# Patient Record
Sex: Female | Born: 1935 | Race: White | Hispanic: No | Marital: Married | Smoking: Never smoker
Health system: Southern US, Community
[De-identification: ages and names within clinical notes are randomized; demographics above are authoritative.]

## PROBLEM LIST (undated history)

## (undated) DIAGNOSIS — Z86010 Personal history of colonic polyps: Secondary | ICD-10-CM

## (undated) DIAGNOSIS — R200 Anesthesia of skin: Secondary | ICD-10-CM

## (undated) DIAGNOSIS — C44301 Unspecified malignant neoplasm of skin of nose: Secondary | ICD-10-CM

## (undated) DIAGNOSIS — M810 Age-related osteoporosis without current pathological fracture: Secondary | ICD-10-CM

## (undated) DIAGNOSIS — Z8709 Personal history of other diseases of the respiratory system: Secondary | ICD-10-CM

## (undated) DIAGNOSIS — J189 Pneumonia, unspecified organism: Secondary | ICD-10-CM

## (undated) DIAGNOSIS — R350 Frequency of micturition: Secondary | ICD-10-CM

## (undated) DIAGNOSIS — K219 Gastro-esophageal reflux disease without esophagitis: Secondary | ICD-10-CM

## (undated) DIAGNOSIS — C801 Malignant (primary) neoplasm, unspecified: Secondary | ICD-10-CM

## (undated) DIAGNOSIS — M2550 Pain in unspecified joint: Secondary | ICD-10-CM

## (undated) DIAGNOSIS — R351 Nocturia: Secondary | ICD-10-CM

## (undated) DIAGNOSIS — M1990 Unspecified osteoarthritis, unspecified site: Secondary | ICD-10-CM

## (undated) DIAGNOSIS — Z8669 Personal history of other diseases of the nervous system and sense organs: Secondary | ICD-10-CM

## (undated) DIAGNOSIS — E785 Hyperlipidemia, unspecified: Secondary | ICD-10-CM

## (undated) DIAGNOSIS — J302 Other seasonal allergic rhinitis: Secondary | ICD-10-CM

## (undated) DIAGNOSIS — M2540 Effusion, unspecified joint: Secondary | ICD-10-CM

## (undated) HISTORY — PX: COLONOSCOPY: SHX174

## (undated) HISTORY — PX: OTHER SURGICAL HISTORY: SHX169

## (undated) HISTORY — PX: APPENDECTOMY: SHX54

## (undated) HISTORY — PX: ESOPHAGOGASTRODUODENOSCOPY: SHX1529

## (undated) HISTORY — PX: MASTECTOMY: SHX3

---

## 2011-05-29 DIAGNOSIS — G589 Mononeuropathy, unspecified: Secondary | ICD-10-CM | POA: Diagnosis not present

## 2011-06-18 DIAGNOSIS — M79609 Pain in unspecified limb: Secondary | ICD-10-CM | POA: Diagnosis not present

## 2011-06-19 DIAGNOSIS — M79609 Pain in unspecified limb: Secondary | ICD-10-CM | POA: Diagnosis not present

## 2011-11-21 DIAGNOSIS — H26499 Other secondary cataract, unspecified eye: Secondary | ICD-10-CM | POA: Diagnosis not present

## 2011-11-21 DIAGNOSIS — H04129 Dry eye syndrome of unspecified lacrimal gland: Secondary | ICD-10-CM | POA: Diagnosis not present

## 2011-12-09 DIAGNOSIS — T148XXA Other injury of unspecified body region, initial encounter: Secondary | ICD-10-CM | POA: Diagnosis not present

## 2012-01-16 DIAGNOSIS — Z23 Encounter for immunization: Secondary | ICD-10-CM | POA: Diagnosis not present

## 2012-02-12 DIAGNOSIS — E7800 Pure hypercholesterolemia, unspecified: Secondary | ICD-10-CM | POA: Diagnosis not present

## 2012-02-12 DIAGNOSIS — Z79899 Other long term (current) drug therapy: Secondary | ICD-10-CM | POA: Diagnosis not present

## 2012-02-12 DIAGNOSIS — E559 Vitamin D deficiency, unspecified: Secondary | ICD-10-CM | POA: Diagnosis not present

## 2012-02-12 DIAGNOSIS — K5900 Constipation, unspecified: Secondary | ICD-10-CM | POA: Diagnosis not present

## 2012-03-08 DIAGNOSIS — J069 Acute upper respiratory infection, unspecified: Secondary | ICD-10-CM | POA: Diagnosis not present

## 2012-04-01 DIAGNOSIS — Z1231 Encounter for screening mammogram for malignant neoplasm of breast: Secondary | ICD-10-CM | POA: Diagnosis not present

## 2012-07-01 DIAGNOSIS — M25559 Pain in unspecified hip: Secondary | ICD-10-CM | POA: Diagnosis not present

## 2012-07-09 DIAGNOSIS — M1610 Unilateral primary osteoarthritis, unspecified hip: Secondary | ICD-10-CM | POA: Diagnosis not present

## 2012-08-06 DIAGNOSIS — M6281 Muscle weakness (generalized): Secondary | ICD-10-CM | POA: Diagnosis not present

## 2012-08-06 DIAGNOSIS — R269 Unspecified abnormalities of gait and mobility: Secondary | ICD-10-CM | POA: Diagnosis not present

## 2012-08-06 DIAGNOSIS — M1610 Unilateral primary osteoarthritis, unspecified hip: Secondary | ICD-10-CM | POA: Diagnosis not present

## 2012-08-06 DIAGNOSIS — M899 Disorder of bone, unspecified: Secondary | ICD-10-CM | POA: Diagnosis not present

## 2012-10-06 DIAGNOSIS — C44319 Basal cell carcinoma of skin of other parts of face: Secondary | ICD-10-CM | POA: Diagnosis not present

## 2012-10-14 DIAGNOSIS — N63 Unspecified lump in unspecified breast: Secondary | ICD-10-CM | POA: Diagnosis not present

## 2012-10-18 DIAGNOSIS — N63 Unspecified lump in unspecified breast: Secondary | ICD-10-CM | POA: Diagnosis not present

## 2012-10-20 DIAGNOSIS — C44319 Basal cell carcinoma of skin of other parts of face: Secondary | ICD-10-CM | POA: Diagnosis not present

## 2012-10-25 DIAGNOSIS — R928 Other abnormal and inconclusive findings on diagnostic imaging of breast: Secondary | ICD-10-CM | POA: Diagnosis not present

## 2012-10-25 DIAGNOSIS — C50919 Malignant neoplasm of unspecified site of unspecified female breast: Secondary | ICD-10-CM | POA: Diagnosis not present

## 2012-10-26 DIAGNOSIS — M25559 Pain in unspecified hip: Secondary | ICD-10-CM | POA: Diagnosis not present

## 2012-10-27 DIAGNOSIS — C50919 Malignant neoplasm of unspecified site of unspecified female breast: Secondary | ICD-10-CM | POA: Diagnosis not present

## 2012-11-09 DIAGNOSIS — D485 Neoplasm of uncertain behavior of skin: Secondary | ICD-10-CM | POA: Diagnosis not present

## 2012-11-10 DIAGNOSIS — C50919 Malignant neoplasm of unspecified site of unspecified female breast: Secondary | ICD-10-CM | POA: Diagnosis not present

## 2012-11-15 DIAGNOSIS — C50919 Malignant neoplasm of unspecified site of unspecified female breast: Secondary | ICD-10-CM | POA: Diagnosis not present

## 2012-11-15 DIAGNOSIS — E785 Hyperlipidemia, unspecified: Secondary | ICD-10-CM | POA: Diagnosis not present

## 2012-11-15 DIAGNOSIS — D0590 Unspecified type of carcinoma in situ of unspecified breast: Secondary | ICD-10-CM | POA: Diagnosis not present

## 2012-11-15 DIAGNOSIS — R9431 Abnormal electrocardiogram [ECG] [EKG]: Secondary | ICD-10-CM | POA: Diagnosis not present

## 2012-11-15 DIAGNOSIS — Z79899 Other long term (current) drug therapy: Secondary | ICD-10-CM | POA: Diagnosis not present

## 2012-11-15 DIAGNOSIS — M129 Arthropathy, unspecified: Secondary | ICD-10-CM | POA: Diagnosis not present

## 2012-11-23 DIAGNOSIS — M79609 Pain in unspecified limb: Secondary | ICD-10-CM | POA: Diagnosis not present

## 2012-12-06 DIAGNOSIS — C50919 Malignant neoplasm of unspecified site of unspecified female breast: Secondary | ICD-10-CM | POA: Diagnosis not present

## 2012-12-06 DIAGNOSIS — Z79899 Other long term (current) drug therapy: Secondary | ICD-10-CM | POA: Diagnosis not present

## 2012-12-06 DIAGNOSIS — E785 Hyperlipidemia, unspecified: Secondary | ICD-10-CM | POA: Diagnosis not present

## 2012-12-06 DIAGNOSIS — D0590 Unspecified type of carcinoma in situ of unspecified breast: Secondary | ICD-10-CM | POA: Diagnosis not present

## 2012-12-14 DIAGNOSIS — M76899 Other specified enthesopathies of unspecified lower limb, excluding foot: Secondary | ICD-10-CM | POA: Diagnosis not present

## 2013-01-10 DIAGNOSIS — C50919 Malignant neoplasm of unspecified site of unspecified female breast: Secondary | ICD-10-CM | POA: Diagnosis not present

## 2013-01-10 DIAGNOSIS — Z79899 Other long term (current) drug therapy: Secondary | ICD-10-CM | POA: Diagnosis not present

## 2013-01-10 DIAGNOSIS — N6019 Diffuse cystic mastopathy of unspecified breast: Secondary | ICD-10-CM | POA: Diagnosis not present

## 2013-01-10 DIAGNOSIS — E785 Hyperlipidemia, unspecified: Secondary | ICD-10-CM | POA: Diagnosis not present

## 2013-01-10 DIAGNOSIS — N6089 Other benign mammary dysplasias of unspecified breast: Secondary | ICD-10-CM | POA: Diagnosis not present

## 2013-01-11 DIAGNOSIS — C50919 Malignant neoplasm of unspecified site of unspecified female breast: Secondary | ICD-10-CM | POA: Diagnosis not present

## 2013-01-11 DIAGNOSIS — Z79899 Other long term (current) drug therapy: Secondary | ICD-10-CM | POA: Diagnosis not present

## 2013-01-11 DIAGNOSIS — E785 Hyperlipidemia, unspecified: Secondary | ICD-10-CM | POA: Diagnosis not present

## 2013-01-26 DIAGNOSIS — Z23 Encounter for immunization: Secondary | ICD-10-CM | POA: Diagnosis not present

## 2013-02-08 DIAGNOSIS — M25473 Effusion, unspecified ankle: Secondary | ICD-10-CM | POA: Diagnosis not present

## 2013-02-08 DIAGNOSIS — M79609 Pain in unspecified limb: Secondary | ICD-10-CM | POA: Diagnosis not present

## 2013-02-08 DIAGNOSIS — M7989 Other specified soft tissue disorders: Secondary | ICD-10-CM | POA: Diagnosis not present

## 2013-02-23 DIAGNOSIS — C50919 Malignant neoplasm of unspecified site of unspecified female breast: Secondary | ICD-10-CM | POA: Diagnosis not present

## 2013-02-28 DIAGNOSIS — M25579 Pain in unspecified ankle and joints of unspecified foot: Secondary | ICD-10-CM | POA: Diagnosis not present

## 2013-02-28 DIAGNOSIS — S82409A Unspecified fracture of shaft of unspecified fibula, initial encounter for closed fracture: Secondary | ICD-10-CM | POA: Diagnosis not present

## 2013-03-30 DIAGNOSIS — M25579 Pain in unspecified ankle and joints of unspecified foot: Secondary | ICD-10-CM | POA: Diagnosis not present

## 2013-03-30 DIAGNOSIS — S82409A Unspecified fracture of shaft of unspecified fibula, initial encounter for closed fracture: Secondary | ICD-10-CM | POA: Diagnosis not present

## 2013-04-21 DIAGNOSIS — M899 Disorder of bone, unspecified: Secondary | ICD-10-CM | POA: Diagnosis not present

## 2013-04-21 DIAGNOSIS — M949 Disorder of cartilage, unspecified: Secondary | ICD-10-CM | POA: Diagnosis not present

## 2013-04-21 DIAGNOSIS — Z0000 Encounter for general adult medical examination without abnormal findings: Secondary | ICD-10-CM | POA: Diagnosis not present

## 2013-04-21 DIAGNOSIS — E7800 Pure hypercholesterolemia, unspecified: Secondary | ICD-10-CM | POA: Diagnosis not present

## 2013-05-11 DIAGNOSIS — C50919 Malignant neoplasm of unspecified site of unspecified female breast: Secondary | ICD-10-CM | POA: Diagnosis not present

## 2013-05-11 DIAGNOSIS — S82409A Unspecified fracture of shaft of unspecified fibula, initial encounter for closed fracture: Secondary | ICD-10-CM | POA: Diagnosis not present

## 2013-05-11 DIAGNOSIS — M25579 Pain in unspecified ankle and joints of unspecified foot: Secondary | ICD-10-CM | POA: Diagnosis not present

## 2013-06-03 DIAGNOSIS — Z803 Family history of malignant neoplasm of breast: Secondary | ICD-10-CM | POA: Diagnosis not present

## 2013-06-03 DIAGNOSIS — Z853 Personal history of malignant neoplasm of breast: Secondary | ICD-10-CM | POA: Diagnosis not present

## 2013-06-03 DIAGNOSIS — Z09 Encounter for follow-up examination after completed treatment for conditions other than malignant neoplasm: Secondary | ICD-10-CM | POA: Diagnosis not present

## 2013-06-03 DIAGNOSIS — Z808 Family history of malignant neoplasm of other organs or systems: Secondary | ICD-10-CM | POA: Diagnosis not present

## 2013-06-03 DIAGNOSIS — C50119 Malignant neoplasm of central portion of unspecified female breast: Secondary | ICD-10-CM | POA: Diagnosis not present

## 2013-07-05 DIAGNOSIS — M25559 Pain in unspecified hip: Secondary | ICD-10-CM | POA: Diagnosis not present

## 2013-07-13 DIAGNOSIS — M1610 Unilateral primary osteoarthritis, unspecified hip: Secondary | ICD-10-CM | POA: Diagnosis not present

## 2013-07-14 DIAGNOSIS — L821 Other seborrheic keratosis: Secondary | ICD-10-CM | POA: Diagnosis not present

## 2013-07-18 DIAGNOSIS — C50119 Malignant neoplasm of central portion of unspecified female breast: Secondary | ICD-10-CM | POA: Diagnosis not present

## 2013-07-19 DIAGNOSIS — C50119 Malignant neoplasm of central portion of unspecified female breast: Secondary | ICD-10-CM | POA: Diagnosis not present

## 2013-07-28 DIAGNOSIS — C44319 Basal cell carcinoma of skin of other parts of face: Secondary | ICD-10-CM | POA: Diagnosis not present

## 2013-08-10 DIAGNOSIS — M1610 Unilateral primary osteoarthritis, unspecified hip: Secondary | ICD-10-CM | POA: Diagnosis not present

## 2013-08-15 DIAGNOSIS — M1610 Unilateral primary osteoarthritis, unspecified hip: Secondary | ICD-10-CM | POA: Diagnosis not present

## 2013-08-15 DIAGNOSIS — M25559 Pain in unspecified hip: Secondary | ICD-10-CM | POA: Diagnosis not present

## 2013-09-02 DIAGNOSIS — Z09 Encounter for follow-up examination after completed treatment for conditions other than malignant neoplasm: Secondary | ICD-10-CM | POA: Diagnosis not present

## 2013-09-02 DIAGNOSIS — Z853 Personal history of malignant neoplasm of breast: Secondary | ICD-10-CM | POA: Diagnosis not present

## 2013-09-14 DIAGNOSIS — Z1231 Encounter for screening mammogram for malignant neoplasm of breast: Secondary | ICD-10-CM | POA: Diagnosis not present

## 2013-09-21 DIAGNOSIS — M1610 Unilateral primary osteoarthritis, unspecified hip: Secondary | ICD-10-CM | POA: Diagnosis not present

## 2013-09-27 DIAGNOSIS — M1610 Unilateral primary osteoarthritis, unspecified hip: Secondary | ICD-10-CM | POA: Diagnosis not present

## 2013-09-27 DIAGNOSIS — M169 Osteoarthritis of hip, unspecified: Secondary | ICD-10-CM | POA: Diagnosis not present

## 2013-09-27 DIAGNOSIS — M25559 Pain in unspecified hip: Secondary | ICD-10-CM | POA: Diagnosis not present

## 2013-10-18 DIAGNOSIS — M1610 Unilateral primary osteoarthritis, unspecified hip: Secondary | ICD-10-CM | POA: Diagnosis not present

## 2013-10-18 DIAGNOSIS — Z8781 Personal history of (healed) traumatic fracture: Secondary | ICD-10-CM | POA: Diagnosis not present

## 2013-10-18 DIAGNOSIS — M169 Osteoarthritis of hip, unspecified: Secondary | ICD-10-CM | POA: Diagnosis not present

## 2013-10-27 DIAGNOSIS — M25559 Pain in unspecified hip: Secondary | ICD-10-CM | POA: Diagnosis not present

## 2013-11-03 DIAGNOSIS — M169 Osteoarthritis of hip, unspecified: Secondary | ICD-10-CM | POA: Diagnosis not present

## 2013-11-03 DIAGNOSIS — M1610 Unilateral primary osteoarthritis, unspecified hip: Secondary | ICD-10-CM | POA: Diagnosis not present

## 2013-11-17 ENCOUNTER — Other Ambulatory Visit: Payer: Self-pay | Admitting: Orthopedic Surgery

## 2013-11-30 ENCOUNTER — Encounter (HOSPITAL_COMMUNITY): Payer: Self-pay | Admitting: Pharmacy Technician

## 2013-12-02 ENCOUNTER — Encounter (HOSPITAL_COMMUNITY): Payer: Self-pay

## 2013-12-02 ENCOUNTER — Encounter (HOSPITAL_COMMUNITY)
Admission: RE | Admit: 2013-12-02 | Discharge: 2013-12-02 | Disposition: A | Discharge status: Home / Self Care | Payer: Medicare Other | Source: Ambulatory Visit | Attending: Orthopedic Surgery | Admitting: Orthopedic Surgery

## 2013-12-02 ENCOUNTER — Ambulatory Visit (HOSPITAL_COMMUNITY)
Admission: RE | Admit: 2013-12-02 | Discharge: 2013-12-02 | Disposition: A | Discharge status: Home / Self Care | Payer: Medicare Other | Source: Ambulatory Visit | Attending: Orthopedic Surgery | Admitting: Orthopedic Surgery

## 2013-12-02 DIAGNOSIS — Z853 Personal history of malignant neoplasm of breast: Secondary | ICD-10-CM | POA: Diagnosis not present

## 2013-12-02 DIAGNOSIS — E785 Hyperlipidemia, unspecified: Secondary | ICD-10-CM | POA: Diagnosis not present

## 2013-12-02 DIAGNOSIS — M129 Arthropathy, unspecified: Secondary | ICD-10-CM | POA: Insufficient documentation

## 2013-12-02 DIAGNOSIS — Z01818 Encounter for other preprocedural examination: Secondary | ICD-10-CM | POA: Insufficient documentation

## 2013-12-02 HISTORY — DX: Effusion, unspecified joint: M25.40

## 2013-12-02 HISTORY — DX: Nocturia: R35.1

## 2013-12-02 HISTORY — DX: Hyperlipidemia, unspecified: E78.5

## 2013-12-02 HISTORY — DX: Personal history of colonic polyps: Z86.010

## 2013-12-02 HISTORY — DX: Personal history of other diseases of the respiratory system: Z87.09

## 2013-12-02 HISTORY — DX: Unspecified osteoarthritis, unspecified site: M19.90

## 2013-12-02 HISTORY — DX: Pneumonia, unspecified organism: J18.9

## 2013-12-02 HISTORY — DX: Age-related osteoporosis without current pathological fracture: M81.0

## 2013-12-02 HISTORY — DX: Personal history of other diseases of the nervous system and sense organs: Z86.69

## 2013-12-02 HISTORY — DX: Anesthesia of skin: R20.0

## 2013-12-02 HISTORY — DX: Malignant (primary) neoplasm, unspecified: C80.1

## 2013-12-02 HISTORY — DX: Frequency of micturition: R35.0

## 2013-12-02 HISTORY — DX: Pain in unspecified joint: M25.50

## 2013-12-02 LAB — BASIC METABOLIC PANEL
Anion gap: 15 (ref 5–15)
BUN: 19 mg/dL (ref 6–23)
CHLORIDE: 99 meq/L (ref 96–112)
CO2: 23 mEq/L (ref 19–32)
CREATININE: 0.91 mg/dL (ref 0.50–1.10)
Calcium: 9.1 mg/dL (ref 8.4–10.5)
GFR calc Af Amer: 65 mL/min — ABNORMAL LOW (ref >90)
GFR calc non Af Amer: 56 mL/min — ABNORMAL LOW (ref >90)
Glucose, Bld: 129 mg/dL — ABNORMAL HIGH (ref 70–99)
Potassium: 4.2 mEq/L (ref 3.7–5.3)
Sodium: 137 mEq/L (ref 137–147)

## 2013-12-02 LAB — CBC WITH DIFFERENTIAL/PLATELET
BASOS PCT: 1 % (ref 0–1)
Basophils Absolute: 0 10*3/uL (ref 0.0–0.1)
EOS ABS: 0 10*3/uL (ref 0.0–0.7)
Eosinophils Relative: 1 % (ref 0–5)
HEMATOCRIT: 40.8 % (ref 36.0–46.0)
HEMOGLOBIN: 13.2 g/dL (ref 12.0–15.0)
LYMPHS ABS: 1.9 10*3/uL (ref 0.7–4.0)
Lymphocytes Relative: 29 % (ref 12–46)
MCH: 30.3 pg (ref 26.0–34.0)
MCHC: 32.4 g/dL (ref 30.0–36.0)
MCV: 93.8 fL (ref 78.0–100.0)
MONO ABS: 0.4 10*3/uL (ref 0.1–1.0)
MONOS PCT: 6 % (ref 3–12)
Neutro Abs: 4 10*3/uL (ref 1.7–7.7)
Neutrophils Relative %: 63 % (ref 43–77)
Platelets: 277 10*3/uL (ref 150–400)
RBC: 4.35 MIL/uL (ref 3.87–5.11)
RDW: 13.7 % (ref 11.5–15.5)
WBC: 6.3 10*3/uL (ref 4.0–10.5)

## 2013-12-02 LAB — URINALYSIS, ROUTINE W REFLEX MICROSCOPIC
GLUCOSE, UA: NEGATIVE mg/dL
Hgb urine dipstick: NEGATIVE
KETONES UR: 15 mg/dL — AB
LEUKOCYTES UA: NEGATIVE
NITRITE: NEGATIVE
Protein, ur: NEGATIVE mg/dL
Specific Gravity, Urine: 1.026 (ref 1.005–1.030)
Urobilinogen, UA: 1 mg/dL (ref 0.0–1.0)
pH: 5.5 (ref 5.0–8.0)

## 2013-12-02 LAB — SURGICAL PCR SCREEN
MRSA, PCR: NEGATIVE
STAPHYLOCOCCUS AUREUS: NEGATIVE

## 2013-12-02 LAB — PROTIME-INR
INR: 1 (ref 0.00–1.49)
Prothrombin Time: 13.2 seconds (ref 11.6–15.2)

## 2013-12-02 LAB — APTT: aPTT: 30 seconds (ref 24–37)

## 2013-12-02 LAB — TYPE AND SCREEN
ABO/RH(D): O POS
Antibody Screen: NEGATIVE

## 2013-12-02 MED ORDER — CHLORHEXIDINE GLUCONATE 4 % EX LIQD: 60.0000 mL | Freq: Once | CUTANEOUS | Status: DC

## 2013-12-02 NOTE — Pre-Procedure Instructions (Signed)
CASSADEE VANZANDT  12/02/2013   Your procedure is scheduled on:  Mon, Sept 14 @ 12:10 PM  Report to [facility]  at 10:10 AM.  Call this number if you have problems the morning of surgery: [phone]   Remember:   Do not eat food or drink liquids after midnight.   Take these medicines the morning of surgery with A SIP OF WATER: Pain Pill(if needed)               No Goody's,BC's,Aleve,Aspirin,Ibuprofen,Fish Oil,or any Herbal Medications   Do not wear jewelry, make-up or nail polish.  Do not wear lotions, powders, or perfumes. You may wear deodorant.  Do not shave 48 hours prior to surgery.   Do not bring valuables to the hospital.  [facility] is not responsible                  for any belongings or valuables.               Contacts, dentures or bridgework may not be worn into surgery.  Leave suitcase in the car. After surgery it may be brought to your room.  For patients admitted to the hospital, discharge time is determined by your                treatment team.               Special Instructions:  [facility] - Preparing for Surgery  Before surgery, you can play an important role.  Because skin is not sterile, your skin needs to be as free of germs as possible.  You can reduce the number of germs on you skin by washing with CHG (chlorahexidine gluconate) soap before surgery.  CHG is an antiseptic cleaner which kills germs and bonds with the skin to continue killing germs even after washing.  Please DO NOT use if you have an allergy to CHG or antibacterial soaps.  If your skin becomes reddened/irritated stop using the CHG and inform your nurse when you arrive at Short Stay.  Do not shave (including legs and underarms) for at least 48 hours prior to the first CHG shower.  You may shave your face.  Please follow these instructions carefully:   1.  Shower with CHG Soap the night before surgery and the                                morning of Surgery.  2.  If you choose to  wash your hair, wash your hair first as usual with your       normal shampoo.  3.  After you shampoo, rinse your hair and body thoroughly to remove the                      Shampoo.  4.  Use CHG as you would any other liquid soap.  You can apply chg directly       to the skin and wash gently with scrungie or a clean washcloth.  5.  Apply the CHG Soap to your body ONLY FROM THE NECK DOWN.        Do not use on open wounds or open sores.  Avoid contact with your eyes,       ears, mouth and genitals (private parts).  Wash genitals (private parts)       with your normal soap.  6.  Wash thoroughly, paying special attention to the area where your surgery        will be performed.  7.  Thoroughly rinse your body with warm water from the neck down.  8.  DO NOT shower/wash with your normal soap after using and rinsing off       the CHG Soap.  9.  Pat yourself dry with a clean towel.            10.  Wear clean pajamas.            11.  Place clean sheets on your bed the night of your first shower and do not        sleep with pets.  Day of Surgery  Do not apply any lotions/deoderants the morning of surgery.  Please wear clean clothes to the hospital/surgery center.     Please read over the following fact sheets that you were given: Pain Booklet, Coughing and Deep Breathing, Blood Transfusion Information, MRSA Information and Surgical Site Infection Prevention

## 2013-12-02 NOTE — Progress Notes (Signed)
Pt doesn't have cardiologist  Stress test done > 3yrs   Denies ever having an echo or heart cath  Medical Md is Dr.Lynley Helene Kelp with [facility] in [place]  Denies EKG or CXR in past yr

## 2013-12-03 LAB — ABO/RH: ABO/RH(D): O POS

## 2013-12-07 NOTE — Progress Notes (Signed)
Anesthesia Chart Review:  Pt is 80 year old female scheduled for R hip arthroplasty on 914/15 with Dr. Mayer Camel.   PMH: Hyperlipidemia, breast cancer, numbness, osteoporosis, athritis  Medications include: vytorin, oxycodone  Preoperative labs reviewed.   Chest x-ray reviewed. Questionable nodular density in medial left lung apex. Lordotic views are recommended for further evaluation. Hyperinflated lungs consistent with COPD. Juliann Pulse with Dr. Damita Dunnings office is aware, and is having pt f/u with PCP.   EKG:  Normal sinus rhythm Left axis deviation  If no changes, I anticipate pt can proceed with surgery as scheduled.   Willeen Cass, FNP-BC [facility]/Anesthesiology Phone: [phone] 12/07/2013 3:02 PM

## 2013-12-08 NOTE — H&P (Signed)
TOTAL HIP ADMISSION H&P  Patient is admitted for right total hip arthroplasty.  Subjective:  Chief Complaint: right hip pain  HPI: Erin Lutz, 80 y.o. female, has a history of pain and functional disability in the right hip(s) due to arthritis and patient has failed non-surgical conservative treatments for greater than 12 weeks to include NSAID's and/or analgesics, corticosteriod injections, flexibility and strengthening excercises, use of assistive devices and activity modification.  Onset of symptoms was gradual starting 1 years ago with gradually worsening course since that time.The patient noted no past surgery on the right hip(s).  Patient currently rates pain in the right hip at 10 out of 10 with activity. Patient has night pain, worsening of pain with activity and weight bearing, pain that interfers with activities of daily living and pain with passive range of motion. Patient has evidence of joint space narrowing by imaging studies. This condition presents safety issues increasing the risk of falls.   There is no current active infection.  There are no active problems to display for this patient.  Past Medical History  Diagnosis Date  . Hyperlipidemia     takes Vytorin every other day  . Cancer     breast  . Pneumonia 66 yrs ago  . History of bronchitis 57yrs ago  . History of migraine 69yrs ago  . Numbness     bilateral feet  . Arthritis   . Joint pain   . Joint swelling   . Osteoporosis   . History of colon polyps   . Urinary frequency   . Nocturia     Past Surgical History  Procedure Laterality Date  . Cleft palate surgery  as a child  . Appendectomy    . Polyp removed from uterus    . Cataract surgery Bilateral   . Mastectomy Left   . Colonoscopy    . Esophagogastroduodenoscopy      No prescriptions prior to admission   No Known Allergies  History  Substance Use Topics  . Smoking status: Never Smoker   . Smokeless tobacco: Not on file  . Alcohol Use:  No    No family history on file.   Review of Systems  Constitutional: Negative.   HENT: Negative.   Eyes: Negative.   Respiratory: Negative.   Cardiovascular: Negative.   Gastrointestinal: Negative.   Genitourinary: Negative.   Musculoskeletal: Positive for joint pain.  Skin: Negative.   Endo/Heme/Allergies: Bruises/bleeds easily.  Psychiatric/Behavioral: Positive for depression. The patient is nervous/anxious.     Objective:  Physical Exam  Constitutional: She is oriented to person, place, and time. She appears well-developed and well-nourished.  HENT:  Head: Normocephalic and atraumatic.  Eyes: Pupils are equal, round, and reactive to light.  Neck: Normal range of motion. Neck supple.  Cardiovascular: Intact distal pulses.   Respiratory: Effort normal.  Musculoskeletal: She exhibits tenderness.  Internal rotation of the right hip is to 10 with severe pain external rotation, less so.  The left hip also has limited internal rotation, but is much less irritable.  Foot tap is negative.  Her skin is intact.  She is nontender to palpation over the greater trochanter, buttock or groin although she says the groin is where her pain is.    Neurological: She is alert and oriented to person, place, and time.  Skin: Skin is warm and dry.  Psychiatric: She has a normal mood and affect. Her behavior is normal. Judgment and thought content normal.    Vital signs in  last 24 hours:    Labs:   There is no height or weight on file to calculate BMI.   Imaging Review Plain radiographs demonstrate AP pelvis and crosstable lateral now show near bone-on-bone arthritis right hip greater than left hip there has been significant progression.  There is also irregularity of the femoral head consistent with some focal necrosis, secondary to pressure.  Assessment/Plan:  End stage arthritis, right hip(s)  The patient history, physical examination, clinical judgement of the provider and imaging  studies are consistent with end stage degenerative joint disease of the right hip(s) and total hip arthroplasty is deemed medically necessary. The treatment options including medical management, injection therapy, arthroscopy and arthroplasty were discussed at length. The risks and benefits of total hip arthroplasty were presented and reviewed. The risks due to aseptic loosening, infection, stiffness, dislocation/subluxation,  thromboembolic complications and other imponderables were discussed.  The patient acknowledged the explanation, agreed to proceed with the plan and consent was signed. Patient is being admitted for inpatient treatment for surgery, pain control, PT, OT, prophylactic antibiotics, VTE prophylaxis, progressive ambulation and ADL's and discharge planning.The patient is planning to be discharged to skilled nursing facility

## 2013-12-09 NOTE — Progress Notes (Signed)
Nurse called patient and instructed her to arrive at 0700 on Monday. Patient verbalized understanding.

## 2013-12-11 MED ORDER — DEXTROSE-NACL 5-0.45 % IV SOLN: INTRAVENOUS | Status: DC

## 2013-12-11 MED ORDER — CEFAZOLIN SODIUM-DEXTROSE 2-3 GM-% IV SOLR
2.0000 g | INTRAVENOUS | Status: AC
  Administered 2013-12-12: 2 g via INTRAVENOUS
  Filled 2013-12-11: qty 50

## 2013-12-12 ENCOUNTER — Encounter (HOSPITAL_COMMUNITY): Payer: Self-pay | Admitting: *Deleted

## 2013-12-12 ENCOUNTER — Inpatient Hospital Stay (HOSPITAL_COMMUNITY): Payer: Medicare Other | Admitting: Anesthesiology

## 2013-12-12 ENCOUNTER — Inpatient Hospital Stay (HOSPITAL_COMMUNITY): Payer: Medicare Other

## 2013-12-12 ENCOUNTER — Encounter (HOSPITAL_COMMUNITY): Payer: Medicare Other | Admitting: Emergency Medicine

## 2013-12-12 ENCOUNTER — Inpatient Hospital Stay (HOSPITAL_COMMUNITY)
Admission: RE | Admit: 2013-12-12 | Discharge: 2013-12-15 | DRG: 470 | Disposition: A | Discharge status: Home Health | Payer: Medicare Other | Source: Ambulatory Visit | Attending: Orthopedic Surgery | Admitting: Orthopedic Surgery

## 2013-12-12 ENCOUNTER — Encounter (HOSPITAL_COMMUNITY)
Admission: RE | Disposition: A | Discharge status: Home Health | Payer: Self-pay | Source: Ambulatory Visit | Attending: Orthopedic Surgery

## 2013-12-12 DIAGNOSIS — M1610 Unilateral primary osteoarthritis, unspecified hip: Principal | ICD-10-CM | POA: Diagnosis present

## 2013-12-12 DIAGNOSIS — E785 Hyperlipidemia, unspecified: Secondary | ICD-10-CM | POA: Diagnosis present

## 2013-12-12 DIAGNOSIS — M810 Age-related osteoporosis without current pathological fracture: Secondary | ICD-10-CM | POA: Diagnosis present

## 2013-12-12 DIAGNOSIS — D62 Acute posthemorrhagic anemia: Secondary | ICD-10-CM | POA: Diagnosis not present

## 2013-12-12 DIAGNOSIS — M169 Osteoarthritis of hip, unspecified: Principal | ICD-10-CM | POA: Diagnosis present

## 2013-12-12 DIAGNOSIS — M1611 Unilateral primary osteoarthritis, right hip: Secondary | ICD-10-CM

## 2013-12-12 DIAGNOSIS — M25559 Pain in unspecified hip: Secondary | ICD-10-CM | POA: Diagnosis not present

## 2013-12-12 HISTORY — PX: TOTAL HIP ARTHROPLASTY: SHX124

## 2013-12-12 SURGERY — ARTHROPLASTY, HIP, TOTAL,POSTERIOR APPROACH
Anesthesia: General | Laterality: Right

## 2013-12-12 MED ORDER — BUPIVACAINE-EPINEPHRINE 0.5% -1:200000 IJ SOLN
INTRAMUSCULAR | Status: DC | PRN
  Administered 2013-12-12: 20 mL

## 2013-12-12 MED ORDER — METHOCARBAMOL 500 MG PO TABS
ORAL_TABLET | ORAL | Status: AC
  Filled 2013-12-12: qty 1

## 2013-12-12 MED ORDER — CEFUROXIME SODIUM 1.5 G IJ SOLR
INTRAMUSCULAR | Status: DC | PRN
  Administered 2013-12-12: 1.5 g

## 2013-12-12 MED ORDER — GLYCOPYRROLATE 0.2 MG/ML IJ SOLN
INTRAMUSCULAR | Status: AC
  Filled 2013-12-12: qty 3

## 2013-12-12 MED ORDER — FENTANYL CITRATE 0.05 MG/ML IJ SOLN
INTRAMUSCULAR | Status: DC | PRN
  Administered 2013-12-12: 50 ug via INTRAVENOUS
  Administered 2013-12-12: 100 ug via INTRAVENOUS
  Administered 2013-12-12 (×2): 25 ug via INTRAVENOUS

## 2013-12-12 MED ORDER — MENTHOL 3 MG MT LOZG: 1.0000 | LOZENGE | OROMUCOSAL | Status: DC | PRN

## 2013-12-12 MED ORDER — HYDROMORPHONE HCL PF 1 MG/ML IJ SOLN: 0.5000 mg | INTRAMUSCULAR | Status: DC | PRN

## 2013-12-12 MED ORDER — LACTATED RINGERS IV SOLN
INTRAVENOUS | Status: DC | PRN
  Administered 2013-12-12: 09:00:00 via INTRAVENOUS

## 2013-12-12 MED ORDER — SENNOSIDES-DOCUSATE SODIUM 8.6-50 MG PO TABS: 1.0000 | ORAL_TABLET | Freq: Every evening | ORAL | Status: DC | PRN

## 2013-12-12 MED ORDER — METHOCARBAMOL 1000 MG/10ML IJ SOLN
500.0000 mg | Freq: Four times a day (QID) | INTRAVENOUS | Status: DC | PRN
  Filled 2013-12-12: qty 5

## 2013-12-12 MED ORDER — METOCLOPRAMIDE HCL 5 MG/ML IJ SOLN
5.0000 mg | Freq: Three times a day (TID) | INTRAMUSCULAR | Status: DC | PRN
  Filled 2013-12-12: qty 2

## 2013-12-12 MED ORDER — FLEET ENEMA 7-19 GM/118ML RE ENEM: 1.0000 | ENEMA | Freq: Once | RECTAL | Status: AC | PRN

## 2013-12-12 MED ORDER — NEOSTIGMINE METHYLSULFATE 10 MG/10ML IV SOLN
INTRAVENOUS | Status: AC
Start: 2013-12-12 — End: 2013-12-12
  Filled 2013-12-12: qty 1

## 2013-12-12 MED ORDER — CEFUROXIME SODIUM 1.5 G IJ SOLR
INTRAMUSCULAR | Status: AC
  Filled 2013-12-12: qty 1.5

## 2013-12-12 MED ORDER — ALUMINUM HYDROXIDE GEL 320 MG/5ML PO SUSP
15.0000 mL | ORAL | Status: DC | PRN
  Filled 2013-12-12: qty 30

## 2013-12-12 MED ORDER — LIDOCAINE HCL (CARDIAC) 20 MG/ML IV SOLN
INTRAVENOUS | Status: AC
  Filled 2013-12-12: qty 5

## 2013-12-12 MED ORDER — PROPOFOL 10 MG/ML IV BOLUS
INTRAVENOUS | Status: DC | PRN
Start: 2013-12-12 — End: 2013-12-12
  Administered 2013-12-12: 150 mg via INTRAVENOUS

## 2013-12-12 MED ORDER — ACETAMINOPHEN 325 MG PO TABS
650.0000 mg | ORAL_TABLET | Freq: Four times a day (QID) | ORAL | Status: DC | PRN
  Administered 2013-12-15: 650 mg via ORAL
  Filled 2013-12-12: qty 2

## 2013-12-12 MED ORDER — OXYCODONE HCL 5 MG PO TABS
5.0000 mg | ORAL_TABLET | ORAL | Status: DC | PRN
  Administered 2013-12-12 – 2013-12-13 (×4): 5 mg via ORAL
  Administered 2013-12-13: 10 mg via ORAL
  Administered 2013-12-13: 5 mg via ORAL
  Administered 2013-12-13: 10 mg via ORAL
  Administered 2013-12-13: 5 mg via ORAL
  Administered 2013-12-14: 10 mg via ORAL
  Filled 2013-12-12 (×2): qty 1
  Filled 2013-12-12: qty 2
  Filled 2013-12-12: qty 1
  Filled 2013-12-12 (×3): qty 2

## 2013-12-12 MED ORDER — LACTATED RINGERS IV SOLN
INTRAVENOUS | Status: DC
  Administered 2013-12-12: 08:00:00 via INTRAVENOUS

## 2013-12-12 MED ORDER — BUPIVACAINE-EPINEPHRINE (PF) 0.5% -1:200000 IJ SOLN
INTRAMUSCULAR | Status: AC
  Filled 2013-12-12: qty 30

## 2013-12-12 MED ORDER — ASPIRIN EC 325 MG PO TBEC
325.0000 mg | DELAYED_RELEASE_TABLET | Freq: Every day | ORAL | Status: DC
  Administered 2013-12-13 – 2013-12-15 (×3): 325 mg via ORAL
  Filled 2013-12-12 (×4): qty 1

## 2013-12-12 MED ORDER — ONDANSETRON HCL 4 MG/2ML IJ SOLN
4.0000 mg | Freq: Four times a day (QID) | INTRAMUSCULAR | Status: DC | PRN
  Filled 2013-12-12: qty 2

## 2013-12-12 MED ORDER — SODIUM CHLORIDE 0.9 % IV SOLN
1000.0000 mg | INTRAVENOUS | Status: AC
  Administered 2013-12-12: 1000 mg via INTRAVENOUS
  Filled 2013-12-12: qty 10

## 2013-12-12 MED ORDER — PROPOFOL 10 MG/ML IV BOLUS
INTRAVENOUS | Status: AC
  Filled 2013-12-12: qty 20

## 2013-12-12 MED ORDER — ONDANSETRON HCL 4 MG/2ML IJ SOLN
INTRAMUSCULAR | Status: DC | PRN
  Administered 2013-12-12: 4 mg via INTRAVENOUS

## 2013-12-12 MED ORDER — ONDANSETRON HCL 4 MG/2ML IJ SOLN
INTRAMUSCULAR | Status: AC
  Filled 2013-12-12: qty 2

## 2013-12-12 MED ORDER — ASPIRIN EC 325 MG PO TBEC: 325.0000 mg | DELAYED_RELEASE_TABLET | Freq: Two times a day (BID) | ORAL | Status: DC

## 2013-12-12 MED ORDER — SUCCINYLCHOLINE CHLORIDE 20 MG/ML IJ SOLN
INTRAMUSCULAR | Status: AC
  Filled 2013-12-12: qty 1

## 2013-12-12 MED ORDER — LIDOCAINE HCL (CARDIAC) 20 MG/ML IV SOLN
INTRAVENOUS | Status: DC | PRN
  Administered 2013-12-12: 50 mg via INTRAVENOUS

## 2013-12-12 MED ORDER — PHENOL 1.4 % MT LIQD: 1.0000 | OROMUCOSAL | Status: DC | PRN

## 2013-12-12 MED ORDER — BISACODYL 5 MG PO TBEC: 5.0000 mg | DELAYED_RELEASE_TABLET | Freq: Every day | ORAL | Status: DC | PRN

## 2013-12-12 MED ORDER — ROCURONIUM BROMIDE 100 MG/10ML IV SOLN
INTRAVENOUS | Status: DC | PRN
  Administered 2013-12-12: 40 mg via INTRAVENOUS

## 2013-12-12 MED ORDER — HYDROMORPHONE HCL PF 1 MG/ML IJ SOLN
INTRAMUSCULAR | Status: AC
  Filled 2013-12-12: qty 1

## 2013-12-12 MED ORDER — DOCUSATE SODIUM 100 MG PO CAPS
100.0000 mg | ORAL_CAPSULE | Freq: Two times a day (BID) | ORAL | Status: DC
  Administered 2013-12-12 – 2013-12-15 (×6): 100 mg via ORAL
  Filled 2013-12-12 (×9): qty 1

## 2013-12-12 MED ORDER — FENTANYL CITRATE 0.05 MG/ML IJ SOLN
INTRAMUSCULAR | Status: AC
  Filled 2013-12-12: qty 5

## 2013-12-12 MED ORDER — HYDROMORPHONE HCL PF 1 MG/ML IJ SOLN
0.2500 mg | INTRAMUSCULAR | Status: DC | PRN
Start: 2013-12-12 — End: 2013-12-12
  Administered 2013-12-12 (×4): 0.5 mg via INTRAVENOUS

## 2013-12-12 MED ORDER — SODIUM CHLORIDE 0.9 % IR SOLN
Status: DC | PRN
  Administered 2013-12-12: 1000 mL

## 2013-12-12 MED ORDER — OXYCODONE HCL 5 MG PO TABS
ORAL_TABLET | ORAL | Status: AC
  Filled 2013-12-12: qty 1

## 2013-12-12 MED ORDER — NEOSTIGMINE METHYLSULFATE 10 MG/10ML IV SOLN
INTRAVENOUS | Status: DC | PRN
  Administered 2013-12-12: 3 mg via INTRAVENOUS

## 2013-12-12 MED ORDER — ROCURONIUM BROMIDE 50 MG/5ML IV SOLN
INTRAVENOUS | Status: AC
  Filled 2013-12-12: qty 1

## 2013-12-12 MED ORDER — METHOCARBAMOL 500 MG PO TABS
500.0000 mg | ORAL_TABLET | Freq: Four times a day (QID) | ORAL | Status: DC | PRN
  Administered 2013-12-12: 500 mg via ORAL
  Filled 2013-12-12: qty 1

## 2013-12-12 MED ORDER — ONDANSETRON HCL 4 MG PO TABS
4.0000 mg | ORAL_TABLET | Freq: Four times a day (QID) | ORAL | Status: DC | PRN
  Filled 2013-12-12: qty 1

## 2013-12-12 MED ORDER — METOCLOPRAMIDE HCL 5 MG PO TABS
5.0000 mg | ORAL_TABLET | Freq: Three times a day (TID) | ORAL | Status: DC | PRN
  Filled 2013-12-12: qty 2

## 2013-12-12 MED ORDER — DIPHENHYDRAMINE HCL 12.5 MG/5ML PO ELIX: 12.5000 mg | ORAL_SOLUTION | ORAL | Status: DC | PRN

## 2013-12-12 MED ORDER — ACETAMINOPHEN 650 MG RE SUPP: 650.0000 mg | Freq: Four times a day (QID) | RECTAL | Status: DC | PRN

## 2013-12-12 MED ORDER — METHOCARBAMOL 500 MG PO TABS: 500.0000 mg | ORAL_TABLET | Freq: Two times a day (BID) | ORAL | Status: DC

## 2013-12-12 MED ORDER — OXYCODONE-ACETAMINOPHEN 5-325 MG PO TABS: 1.0000 | ORAL_TABLET | ORAL | Status: DC | PRN

## 2013-12-12 MED ORDER — GLYCOPYRROLATE 0.2 MG/ML IJ SOLN
INTRAMUSCULAR | Status: DC | PRN
  Administered 2013-12-12: .6 mg via INTRAVENOUS

## 2013-12-12 MED ORDER — KCL IN DEXTROSE-NACL 20-5-0.45 MEQ/L-%-% IV SOLN
INTRAVENOUS | Status: DC
  Administered 2013-12-13 (×2): via INTRAVENOUS
  Filled 2013-12-12 (×11): qty 1000

## 2013-12-12 SURGICAL SUPPLY — 55 items
BLADE SAW SGTL 18X1.27X75 (BLADE) ×2 IMPLANT
BLADE SAW SGTL 18X1.27X75MM (BLADE) ×1
BRUSH FEMORAL CANAL (MISCELLANEOUS) IMPLANT
CAPT HIP MOP CEMENTED ×3 IMPLANT
CEMENT BONE DEPUY (Cement) ×6 IMPLANT
CEMENT RESTRICTOR DEPUY SZ 3 (Cement) ×3 IMPLANT
COVER BACK TABLE 24X17X13 BIG (DRAPES) IMPLANT
COVER SURGICAL LIGHT HANDLE (MISCELLANEOUS) ×6 IMPLANT
DRAPE ORTHO SPLIT 77X108 STRL (DRAPES) ×2
DRAPE PROXIMA HALF (DRAPES) ×3 IMPLANT
DRAPE SURG ORHT 6 SPLT 77X108 (DRAPES) ×1 IMPLANT
DRAPE U-SHAPE 47X51 STRL (DRAPES) ×3 IMPLANT
DRILL BIT 7/64X5 (BIT) ×3 IMPLANT
DRSG AQUACEL AG ADV 3.5X10 (GAUZE/BANDAGES/DRESSINGS) ×3 IMPLANT
DURAPREP 26ML APPLICATOR (WOUND CARE) ×3 IMPLANT
ELECT BLADE 4.0 EZ CLEAN MEGAD (MISCELLANEOUS) ×3
ELECT REM PT RETURN 9FT ADLT (ELECTROSURGICAL) ×3
ELECTRODE BLDE 4.0 EZ CLN MEGD (MISCELLANEOUS) ×1 IMPLANT
ELECTRODE REM PT RTRN 9FT ADLT (ELECTROSURGICAL) ×1 IMPLANT
GAUZE XEROFORM 1X8 LF (GAUZE/BANDAGES/DRESSINGS) ×3 IMPLANT
GLOVE BIO SURGEON STRL SZ7.5 (GLOVE) ×3 IMPLANT
GLOVE BIO SURGEON STRL SZ8.5 (GLOVE) ×6 IMPLANT
GLOVE BIOGEL PI IND STRL 8 (GLOVE) ×2 IMPLANT
GLOVE BIOGEL PI IND STRL 9 (GLOVE) ×1 IMPLANT
GLOVE BIOGEL PI INDICATOR 8 (GLOVE) ×4
GLOVE BIOGEL PI INDICATOR 9 (GLOVE) ×2
GOWN STRL REUS W/ TWL LRG LVL3 (GOWN DISPOSABLE) ×1 IMPLANT
GOWN STRL REUS W/ TWL XL LVL3 (GOWN DISPOSABLE) ×2 IMPLANT
GOWN STRL REUS W/TWL LRG LVL3 (GOWN DISPOSABLE) ×2
GOWN STRL REUS W/TWL XL LVL3 (GOWN DISPOSABLE) ×4
HANDPIECE INTERPULSE COAX TIP (DISPOSABLE)
HOOD PEEL AWAY FACE SHEILD DIS (HOOD) ×6 IMPLANT
KIT BASIN OR (CUSTOM PROCEDURE TRAY) ×3 IMPLANT
KIT ROOM TURNOVER OR (KITS) ×3 IMPLANT
MANIFOLD NEPTUNE II (INSTRUMENTS) ×3 IMPLANT
NEEDLE 22X1 1/2 (OR ONLY) (NEEDLE) ×3 IMPLANT
NS IRRIG 1000ML POUR BTL (IV SOLUTION) ×3 IMPLANT
PACK TOTAL JOINT (CUSTOM PROCEDURE TRAY) ×3 IMPLANT
PAD ARMBOARD 7.5X6 YLW CONV (MISCELLANEOUS) ×6 IMPLANT
PASSER SUT SWANSON 36MM LOOP (INSTRUMENTS) ×3 IMPLANT
PRESSURIZER FEMORAL UNIV (MISCELLANEOUS) ×3 IMPLANT
SET HNDPC FAN SPRY TIP SCT (DISPOSABLE) IMPLANT
SUT ETHIBOND 2 V 37 (SUTURE) ×3 IMPLANT
SUT VIC AB 0 CTB1 27 (SUTURE) ×3 IMPLANT
SUT VIC AB 1 CTX 36 (SUTURE) ×2
SUT VIC AB 1 CTX36XBRD ANBCTR (SUTURE) ×1 IMPLANT
SUT VIC AB 2-0 CTB1 (SUTURE) ×3 IMPLANT
SUT VIC AB 3-0 SH 27 (SUTURE) ×2
SUT VIC AB 3-0 SH 27X BRD (SUTURE) ×1 IMPLANT
SYR CONTROL 10ML LL (SYRINGE) ×3 IMPLANT
TOWEL OR 17X24 6PK STRL BLUE (TOWEL DISPOSABLE) ×3 IMPLANT
TOWEL OR 17X26 10 PK STRL BLUE (TOWEL DISPOSABLE) ×3 IMPLANT
TOWER CARTRIDGE SMART MIX (DISPOSABLE) ×3 IMPLANT
TRAY FOLEY CATH 14FR (SET/KITS/TRAYS/PACK) IMPLANT
WATER STERILE IRR 1000ML POUR (IV SOLUTION) ×12 IMPLANT

## 2013-12-12 NOTE — Interval H&P Note (Signed)
History and Physical Interval Note:  12/12/2013 7:17 AM  Erin Lutz  has presented today for surgery, with the diagnosis of OSTEOARTHRITIS RIGHT HIP  The various methods of treatment have been discussed with the patient and family. After consideration of risks, benefits and other options for treatment, the patient has consented to  Procedure(s): RIGHT TOTAL HIP ARTHROPLASTY (Right) as a surgical intervention .  The patient's history has been reviewed, patient examined, no change in status, stable for surgery.  I have reviewed the patient's chart and labs.  Questions were answered to the patient's satisfaction.     Kerin Salen

## 2013-12-12 NOTE — Transfer of Care (Signed)
Immediate Anesthesia Transfer of Care Note  Patient: Erin Lutz  Procedure(s) Performed: Procedure(s): RIGHT TOTAL HIP ARTHROPLASTY (Right)  Patient Location: PACU  Anesthesia Type:General  Level of Consciousness: awake and alert   Airway & Oxygen Therapy: Patient Spontanous Breathing and Patient connected to nasal cannula oxygen  Post-op Assessment: Report given to PACU RN and Post -op Vital signs reviewed and stable  Post vital signs: Reviewed and stable  Complications: No apparent anesthesia complications

## 2013-12-12 NOTE — Anesthesia Postprocedure Evaluation (Signed)
  Anesthesia Post-op Note  Patient: Erin Lutz  Procedure(s) Performed: Procedure(s): RIGHT TOTAL HIP ARTHROPLASTY (Right)  Patient Location: PACU  Anesthesia Type:General  Level of Consciousness: awake  Airway and Oxygen Therapy: Patient Spontanous Breathing  Post-op Pain: mild  Post-op Assessment: Post-op Vital signs reviewed  Post-op Vital Signs: Reviewed  Last Vitals:  Filed Vitals:   12/12/13 1225  BP:   Pulse:   Temp: 36.2 C  Resp:     Complications: No apparent anesthesia complications

## 2013-12-12 NOTE — Anesthesia Procedure Notes (Signed)
Procedure Name: Intubation Date/Time: 12/12/2013 9:19 AM Performed by: Eligha Bridegroom Pre-anesthesia Checklist: Patient identified, Timeout performed, Emergency Drugs available, Suction available and Patient being monitored Patient Re-evaluated:Patient Re-evaluated prior to inductionOxygen Delivery Method: Circle system utilized Preoxygenation: Pre-oxygenation with 100% oxygen Intubation Type: IV induction Ventilation: Mask ventilation without difficulty Grade View: Grade III Tube type: Oral Tube size: 7.0 mm Number of attempts: 1 Airway Equipment and Method: Video-laryngoscopy and Stylet Placement Confirmation: ETT inserted through vocal cords under direct vision,  breath sounds checked- equal and bilateral and positive ETCO2 Secured at: 22 cm Tube secured with: Tape Dental Injury: Teeth and Oropharynx as per pre-operative assessment  Difficulty Due To: Difficulty was anticipated and Difficult Airway- due to anterior larynx

## 2013-12-12 NOTE — Anesthesia Preprocedure Evaluation (Signed)
Anesthesia Evaluation   Patient awake    Reviewed: Allergy & Precautions, H&P , NPO status , Patient's Chart, lab work & pertinent test results  Airway       Dental   Pulmonary pneumonia -,          Cardiovascular     Neuro/Psych    GI/Hepatic   Endo/Other    Renal/GU      Musculoskeletal  (+) Arthritis -,   Abdominal   Peds  Hematology   Anesthesia Other Findings   Reproductive/Obstetrics                           Anesthesia Physical Anesthesia Plan  ASA: III  Anesthesia Plan: General   Post-op Pain Management:    Induction: Intravenous  Airway Management Planned: Oral ETT  Additional Equipment:   Intra-op Plan:   Post-operative Plan: Possible Post-op intubation/ventilation  Informed Consent: I have reviewed the patients History and Physical, chart, labs and discussed the procedure including the risks, benefits and alternatives for the proposed anesthesia with the patient or authorized representative who has indicated his/her understanding and acceptance.   Dental advisory given  Plan Discussed with: CRNA and Anesthesiologist  Anesthesia Plan Comments:         Anesthesia Quick Evaluation

## 2013-12-12 NOTE — Op Note (Addendum)
PATIENT ID:      Erin Lutz  MRN:     [id number] DOB/AGE:    November 08, 1925 / 80 y.o.       OPERATIVE REPORT    DATE OF PROCEDURE:  12/12/2013       PREOPERATIVE DIAGNOSIS:  OSTEOARTHRITIS RIGHT HIP                                                       Estimated body mass index is 25.97 kg/(m^2) as calculated from the following:   Height as of 12/02/13: 5\' 2"  (1.575 m).   Weight as of this encounter: 64.411 kg (142 lb).     POSTOPERATIVE DIAGNOSIS:  OSTEOARTHRITIS RIGHT HIP                                                           PROCEDURE:  R total hip arthroplasty using a 48 mm DePuy Pinnacle  Cup, [employer], 10-degree polyethylene liner index superior  and posterior, a +1 32 mm metal head, a #4 Summit basic stem, cemented double batch of DePuy HV cement with 1500 mg of Zinacef, 11 mm centralizer and #3 central canal occluder   SURGEON: Daevion Navarette J    ASSISTANT:   Eric K. Barton Dubois  (present throughout entire procedure and necessary for timely completion of the procedure)  ANESTHESIA:  General  BLOOD LOSS: * No blood loss amount entered *  FLUID REPLACEMENT: 1500 crystalloid DRAINS: Foley Catheter COMPLICATIONS:  None    INDICATIONS FOR PROCEDURE:Patient with end-stage arthritis of the R hip.  X-rays show bone-on-bone arthritic changes. Despite conservative measures with observation, anti-inflammatory medicine, narcotics, use of a cane, has severe unremitting pain and can ambulate only 2 blocks before resting.  Patient desires elective right total hip arthroplasty to decrease pain and increase function. The risks, benefits, and alternatives were discussed at length including but not limited to the risks of infection, bleeding, nerve injury, stiffness, blood clots, the need for revision surgery, cardiopulmonary complications, among others, and they were willing to proceed.Benefits have been discussed. Questions answered.     PROCEDURE IN DETAIL: The patient was  identified by armband,  received preoperative IV antibiotics in the holding area at [facility], taken to the operating room , appropriate anesthetic monitors  were attached and general endotracheal anesthesia induced. Foley catheter was inserted. Patient was rolled into the L lateral decubitus position and fixed there with a Stulberg Mark II pelvic clamp and the R lower extremity was then prepped and draped  in the usual sterile fashion from the ankle to the hemipelvis. A time-out  procedure was performed. The skin along the lateral hip and thigh  infiltrated with 10 mL of 0.5% Marcaine and epinephrine solution. We  then made a posterolateral approach to the hip. With a #10 blade, 18 cm  incision through skin and subcutaneous tissue down to the level of the  IT band. Small bleeders were identified and cauterized. IT band cut in  line with skin incision exposing the greater trochanter. A Cobra retractor was placed between the gluteus minimus and the superior hip joint capsule, and  a spiked Cobra between the quadratus femoris and the inferior hip joint capsule. This isolated the short  external rotators and piriformis tendons. These were tagged with a #2 Ethibond  suture and cut off their insertion on the intertrochanteric crest. The posterior  capsule was then developed into an acetabular-based flap from Posterior Superior off of the acetabulum out over the femoral neck and back posterior inferior to the acetabular rim. This flap was tagged with two #2 Ethibond sutures and retracted protecting the sciatic nerve. This exposed the arthritic femoral head and osteophytes. The hip was then flexed and internally rotated, dislocating the femoral head and a standard neck cut performed 1 fingerbreadth above the lesser trochanter.  A spiked Cobra was placed in the cotyloid notch and a Hohmann retractor was then used to lever the femur anteriorly off of the anterior pelvic column. A posterior-inferior wing  retractor was placed at the junction of the acetabulum and the ischium completing the acetabular exposure.We then removed the peripheral osteophytes and labrum from the acetabulum. We then reamed the acetabulum up to 47 mm with basket reamers obtaining good coverage in all quadrants, irrigated out with normal  saline solution and hammered into place a 48 mm pinnacle cup in 45  degrees of abduction and about 20 degrees of anteversion. More  peripheral osteophytes removed and a trial 10-degree liner placed with the  index superior-posterior. The hip was then flexed and internally rotated exposing the  proximal femur, which was entered with the initiating reamer followed by  the tapered reamers up to a 4-5 tapered reamer and broaching up to a #4 broach, which had good fit and fill. A trial reduction was then  performed with a +1  32-mm ball on the standard neck and  excellent stability was noted with at 90 of flexion with 75 of  internal rotation and then full extension withexternal rotation. The hip  could not be dislocated in full extension. The knee could easily flex  to about 140 degrees. We also stretched the abductors at this point,  because of the preexisting adductor contractures. All trial components  were then removed. The acetabulum was irrigated out with normal saline  solution. A titanium Apex [facility] was then screwed into place  followed by a 10-degree polyethylene liner index superior-posterior. On  the femoral side, we then sized for a #3 cement restrictor and irrigated  out the femoral canal with normal saline solution and dried with suction  and sponges. The cement restrictor was placed to the appropriate depth. A  double batch of DePuy 1 cement with 1500 mg of Zinacef was mixed and  injected into the canal under pressure followed by #4 Summit basic stem  in 20 degrees of anteversion and as this was held in place, excess cement  was removed. At this point, a +0 32-mm  metal head was  hammered on the stem. The hip was reduced. We checked our stability  one more time and found to be excellent. The wound was once again  thoroughly irrigated out with normal saline solution pulse lavage. The  capsular flap and short external rotators were repaired back to the  intertrochanteric crest through drill holes with a #2 Ethibond suture.  The IT band was closed with running 1 Vicryl suture. The subcutaneous  tissue with 0 and 2-0 undyed Vicryl suture and the skin with running  interlocking 3-0 nylon suture. Dressing of Xeroform and Mepilex was  then applied. The patient was then unclamped, rolled  supine, awaken extubated and taken to recovery room without difficulty in stable condition.   Anastassia Noack J 12/12/2013, 10:27 AM

## 2013-12-13 LAB — CBC
HCT: 34.3 % — ABNORMAL LOW (ref 36.0–46.0)
HEMOGLOBIN: 11.1 g/dL — AB (ref 12.0–15.0)
MCH: 30.5 pg (ref 26.0–34.0)
MCHC: 32.4 g/dL (ref 30.0–36.0)
MCV: 94.2 fL (ref 78.0–100.0)
Platelets: 183 10*3/uL (ref 150–400)
RBC: 3.64 MIL/uL — AB (ref 3.87–5.11)
RDW: 13.8 % (ref 11.5–15.5)
WBC: 8.3 10*3/uL (ref 4.0–10.5)

## 2013-12-13 LAB — BASIC METABOLIC PANEL
Anion gap: 13 (ref 5–15)
BUN: 9 mg/dL (ref 6–23)
CHLORIDE: 98 meq/L (ref 96–112)
CO2: 24 mEq/L (ref 19–32)
Calcium: 8.2 mg/dL — ABNORMAL LOW (ref 8.4–10.5)
Creatinine, Ser: 0.73 mg/dL (ref 0.50–1.10)
GFR calc Af Amer: 87 mL/min — ABNORMAL LOW (ref >90)
GFR calc non Af Amer: 75 mL/min — ABNORMAL LOW (ref >90)
GLUCOSE: 119 mg/dL — AB (ref 70–99)
Potassium: 4 mEq/L (ref 3.7–5.3)
SODIUM: 135 meq/L — AB (ref 137–147)

## 2013-12-13 NOTE — Evaluation (Signed)
Physical Therapy Evaluation Patient Details Name: Erin Lutz MRN: [id number] DOB: 03-28-1926 Today's Date: 12/13/2013   History of Present Illness  80 y.o. female s/p right total hip arthroplasty via posterior approach.  Clinical Impression  Pt is s/p right THA resulting in the deficits listed below (see PT Problem List). Mod assist for bed mobility and transfers with min guard for ambulation. Pt eager to return home however will need to progress to a safe level of independence prior to returning home. She is motivated to improve and has several steps to climb to enter her house. Will continue to follow acutely, working on these functional tasks.  Pt will benefit from skilled PT to increase their independence and safety with mobility to allow discharge to the venue listed below.        Follow Up Recommendations Home health PT;Supervision for mobility/OOB    Equipment Recommendations  3in1 (PT)    Recommendations for Other Services       Precautions / Restrictions Precautions Precautions: Posterior Hip Precaution Booklet Issued: Yes (comment) Precaution Comments: Reviewed posterior hip precautions Restrictions Weight Bearing Restrictions: Yes RLE Weight Bearing: Weight bearing as tolerated      Mobility  Bed Mobility Overal bed mobility: Needs Assistance Bed Mobility: Supine to Sit     Supine to sit: Mod assist;HOB elevated     General bed mobility comments: Mod assist for trunk control and use of bed pad to scoot forward with HOB slightly elevated and some support for RLE out of bed. She was able to however safely maintain posterior hip precautions while transfering out of the right side of the bed. VC for technique  Transfers Overall transfer level: Needs assistance Equipment used: Rolling walker (2 wheeled) Transfers: Sit to/from Stand Sit to Stand: Mod assist         General transfer comment: Mod assist from lowest bed setting (similar to bed at home) for  boost to stand with VC for hand placement and technique.  Ambulation/Gait Ambulation/Gait assistance: Min guard Ambulation Distance (Feet): 20 Feet Assistive device: Rolling walker (2 wheeled) Gait Pattern/deviations: Step-to pattern;Decreased step length - right;Decreased step length - left;Decreased stance time - right;Decreased stride length;Decreased dorsiflexion - right;Decreased weight shift to right;Shuffle;Antalgic   Gait velocity interpretation: Below normal speed for age/gender General Gait Details: Educated on safe DME use with rolling walker. Poor right foot clearance with swing limb advancement, improves slightly with max cues. VC for sequencing. Very slow and guarded.  Stairs            Wheelchair Mobility    Modified Rankin (Stroke Patients Only)       Balance Overall balance assessment: Needs assistance Sitting-balance support: No upper extremity supported;Feet supported Sitting balance-Leahy Scale: Good     Standing balance support: Bilateral upper extremity supported Standing balance-Leahy Scale: Poor                               Pertinent Vitals/Pain Pain Assessment: 0-10 Pain Score: 5  Pain Location: Right hip Pain Descriptors / Indicators: Throbbing Pain Intervention(s): Monitored during session;Premedicated before session    Home Living Family/patient expects to be discharged to:: Private residence Living Arrangements: Other (Comment);Spouse/significant other (Daughter-in-law) Available Help at Discharge: Family;Available 24 hours/day Type of Home: House Home Access: Stairs to enter Entrance Stairs-Rails: Psychiatric nurse of Steps: 3 Home Layout: Two level;Laundry or work area in basement;Able to live on main level with bedroom/bathroom Home Equipment:  Walker - 2 wheels      Prior Function Level of Independence: Needs assistance   Gait / Transfers Assistance Needed: Cane for ambulation  ADL's / Homemaking  Assistance Needed: Needed assist for dressing lower body        Hand Dominance   Dominant Hand: Right    Extremity/Trunk Assessment   Upper Extremity Assessment: Overall WFL for tasks assessed           Lower Extremity Assessment: RLE deficits/detail RLE Deficits / Details: Decreased strength and ROM as expected post-op - poor hip flexion functionally with ambulation       Communication   Communication: No difficulties  Cognition Arousal/Alertness: Awake/alert Behavior During Therapy: WFL for tasks assessed/performed Overall Cognitive Status: Within Functional Limits for tasks assessed                      General Comments      Exercises Total Joint Exercises Ankle Circles/Pumps: AROM;Both;10 reps;Seated Quad Sets: Strengthening;Both;10 reps;Seated Gluteal Sets: Strengthening;Both;10 reps;Seated      Assessment/Plan    PT Assessment Patient needs continued PT services  PT Diagnosis Difficulty walking;Abnormality of gait;Acute pain   PT Problem List Decreased strength;Decreased range of motion;Decreased activity tolerance;Decreased balance;Decreased mobility;Decreased knowledge of use of DME;Decreased knowledge of precautions;Pain  PT Treatment Interventions DME instruction;Gait training;Stair training;Functional mobility training;Therapeutic activities;Therapeutic exercise;Balance training;Neuromuscular re-education;Patient/family education;Modalities   PT Goals (Current goals can be found in the Care Plan section) Acute Rehab PT Goals Patient Stated Goal: Go home PT Goal Formulation: With patient Time For Goal Achievement: 12/20/13 Potential to Achieve Goals: Good    Frequency 7X/week   Barriers to discharge        Co-evaluation               End of Session   Activity Tolerance: Patient tolerated treatment well Patient left: in chair;with call bell/phone within reach Nurse Communication: Mobility status         Time: 0916-0952 PT  Time Calculation (min): 36 min   Charges:   PT Evaluation $Initial PT Evaluation Tier I: 1 Procedure PT Treatments $Gait Training: 8-22 mins $Therapeutic Activity: 8-22 mins   PT G Codes:        Elayne Snare, [place]   Ellouise Newer 12/13/2013, 10:16 AM

## 2013-12-13 NOTE — Clinical Social Work Note (Signed)
Clinical Social Worker received referral for possible ST-SNF placement.  Chart reviewed.  PT/OT recommending home with home health.  Spoke with RN Case Manager who will follow up with patient to discuss home health needs.    CSW signing off - please re consult if social work needs arise.  Jesse Cyler Kappes, LCSW [phone] 

## 2013-12-13 NOTE — Progress Notes (Signed)
Physical Therapy Treatment Patient Details Name: Erin Lutz MRN: [id number] DOB: 1925/10/10 Today's Date: 12/13/2013    History of Present Illness 80 y.o. female s/p right total hip arthroplasty via posterior approach.    PT Comments    Pt with great difficulty attempting to ascend steps, required max assist to prevent fall. Updated d/c recommendation due to difficulty with this task and slow progression. Pt very determined to return home but will need to greatly progress her independence with mobility in order to mobilize safely. Will re-attempt this task tomorrow with further gait training and therapeutic exercises. Patient will continue to benefit from skilled physical therapy services to further improve independence with functional mobility.   Follow Up Recommendations  Supervision for mobility/OOB;SNF ((HHPT if pt can safely navigate stairs prior to d/c))     Equipment Recommendations  3in1 (PT)    Recommendations for Other Services       Precautions / Restrictions Precautions Precautions: Posterior Hip Precaution Comments: Reviewed posterior hip precautions. Correctly recalls 2/3 Restrictions Weight Bearing Restrictions: Yes RLE Weight Bearing: Weight bearing as tolerated    Mobility  Bed Mobility                  Transfers Overall transfer level: Needs assistance Equipment used: Rolling walker (2 wheeled) Transfers: Sit to/from Stand Sit to Stand: Min assist         General transfer comment: Min assist for boost from recliner x2. VC for technique and hand placement.  Ambulation/Gait Ambulation/Gait assistance: Min assist Ambulation Distance (Feet): 15 Feet Assistive device: Rolling walker (2 wheeled) Gait Pattern/deviations: Step-to pattern;Decreased step length - right;Decreased step length - left;Decreased stance time - right;Decreased stride length;Decreased dorsiflexion - right;Shuffle;Antalgic;Trunk flexed   Gait velocity interpretation:  Below normal speed for age/gender General Gait Details: Fatigues quickly. Min assist for stability and RLE advancement. Practiced pregait training for weight shift and RLE advancement. Continues to be very slow and guarded.   Stairs Stairs: Yes Stairs assistance: Max assist Stair Management: No rails;Step to pattern;Backwards;With walker Number of Stairs: 0 General stair comments: Demonstrated to patient prior to having her practice. She was able to bring LLE onto step however did not have sufficent strength to bring RLE onto step. Pt required max assist to prevent fall while brining LLE down onto ground.  Wheelchair Mobility    Modified Rankin (Stroke Patients Only)       Balance                                    Cognition Arousal/Alertness: Awake/alert Behavior During Therapy: WFL for tasks assessed/performed Overall Cognitive Status: Within Functional Limits for tasks assessed                      Exercises Total Joint Exercises Ankle Circles/Pumps: AROM;Both;10 reps;Seated Quad Sets: Strengthening;Both;10 reps;Seated Gluteal Sets: Strengthening;Both;10 reps;Seated Heel Slides: AAROM;Right;10 reps;Seated Long Arc Quad: AAROM;Right;10 reps;Seated    General Comments        Pertinent Vitals/Pain Pain Assessment: 0-10 Pain Score:  ("sore" no value given) Pain Location: Rt hip Pain Intervention(s): Monitored during session;Repositioned;Limited activity within patient's tolerance    Home Living                      Prior Function            PT Goals (current goals can now be found in  the care plan section) Acute Rehab PT Goals Patient Stated Goal: Go home PT Goal Formulation: With patient Time For Goal Achievement: 12/20/13 Potential to Achieve Goals: Good Progress towards PT goals: Progressing toward goals    Frequency  7X/week    PT Plan Discharge plan needs to be updated    Co-evaluation             End of  Session   Activity Tolerance: Patient limited by fatigue Patient left: in chair;with call bell/phone within reach     Time: 7341-9379 PT Time Calculation (min): 35 min  Charges:  $Gait Training: 8-22 mins $Therapeutic Exercise: 8-22 mins                    G Codes:      [employer], [place]  Ellouise Newer 12/13/2013, 4:42 PM

## 2013-12-13 NOTE — Progress Notes (Signed)
Patient ID: Erin Lutz, female   DOB: 14-Jan-1926, 80 y.o.   MRN: [id number] PATIENT ID: Erin Lutz  MRN: [id number]  DOB/AGE:  06/28/1925 / 80 y.o.  1 Day Post-Op Procedure(s) (LRB): RIGHT TOTAL HIP ARTHROPLASTY (Right)    PROGRESS NOTE Subjective: Patient is alert, oriented, no Nausea, no Vomiting, yes passing gas, no Bowel Movement. Taking PO well. Denies SOB, Chest or Calf Pain. Using Incentive Spirometer, PAS in place. Ambulate WBAT Patient reports pain as 3 on 0-10 scale  .    Objective: Vital signs in last 24 hours: Filed Vitals:   12/12/13 2341 12/13/13 0129 12/13/13 0400 12/13/13 0504  BP:  138/62  156/60  Pulse:  64  93  Temp:  98.3 F (36.8 C)  98.3 F (36.8 C)  TempSrc:      Resp: 16 16 16 18   Weight:      SpO2: 98% 100% 98% 98%      Intake/Output from previous day: I/O last 3 completed shifts: In: 1100 [I.V.:1100] Out: -    Intake/Output this shift:     LABORATORY DATA:  Recent Labs  12/13/13 0452  WBC 8.3  HGB 11.1*  HCT 34.3*  PLT 183  NA 135*  K 4.0  CL 98  CO2 24  BUN 9  CREATININE 0.73  GLUCOSE 119*  CALCIUM 8.2*    Examination: Neurologically intact ABD soft Neurovascular intact Sensation intact distally Intact pulses distally Dorsiflexion/Plantar flexion intact Incision: dressing C/D/I No cellulitis present Compartment soft} XR AP&Lat of hip shows well placed\fixed THA  Assessment:   1 Day Post-Op Procedure(s) (LRB): RIGHT TOTAL HIP ARTHROPLASTY (Right) ADDITIONAL DIAGNOSIS:  Expected Acute Blood Loss Anemia,   Plan: PT/OT WBAT, THA  posterior precautions  DVT Prophylaxis: SCDx72 hrs, ASA 325 mg BID x 2 weeks  DISCHARGE PLAN: Home,Daughter is here to take care of her postoperatively  DISCHARGE NEEDS: HHPT, HHRN, CPM, Walker and 3-in-1 comode seat

## 2013-12-13 NOTE — Evaluation (Signed)
Occupational Therapy Evaluation Patient Details Name: Erin Lutz MRN: [id number] DOB: 03-09-1926 Today's Date: 12/13/2013    History of Present Illness 80 y.o. female s/p right total hip arthroplasty via posterior approach.   Clinical Impression   Pt. Ed. On increasing I and safety with ADLs and mobility with use of AE and DME equipment. Pt. Will need additional training on safe and effective use of equipment. Pt. Will need further instruction on recalling and following hip precautions. Pt. Husband is elderly and may be limited with amount of physical A he can provide. Pt. Does want to d/c home with husband to A.     Follow Up Recommendations  Home health OT;SNF    Equipment Recommendations  None recommended by OT    Recommendations for Other Services       Precautions / Restrictions Precautions Precautions: Posterior Hip Precaution Booklet Issued: Yes (comment) Precaution Comments: Reviewed posterior hip precautions Restrictions Weight Bearing Restrictions: Yes RLE Weight Bearing: Weight bearing as tolerated      Mobility Bed Mobility Overal bed mobility: Needs Assistance Bed Mobility: Supine to Sit     Supine to sit: Mod assist;HOB elevated     General bed mobility comments: Mod assist for trunk control and use of bed pad to scoot forward with HOB slightly elevated and some support for RLE out of bed. She was able to however safely maintain posterior hip precautions while transfering out of the right side of the bed. VC for technique  Transfers Overall transfer level: Needs assistance Equipment used: Rolling walker (2 wheeled) Transfers: Sit to/from Stand Sit to Stand: Mod assist         General transfer comment: Mod assist from lowest bed setting (similar to bed at home) for boost to stand with VC for hand placement and technique.    Balance Overall balance assessment: Needs assistance Sitting-balance support: No upper extremity supported;Feet  supported Sitting balance-Leahy Scale: Good     Standing balance support: Bilateral upper extremity supported Standing balance-Leahy Scale: Poor                              ADL Overall ADL's : Needs assistance/impaired Eating/Feeding: Independent   Grooming: Set up   Upper Body Bathing: Set up   Lower Body Bathing: Maximal assistance   Upper Body Dressing : Min guard   Lower Body Dressing: Total assistance   Toilet Transfer: Moderate assistance   Toileting- Clothing Manipulation and Hygiene: Total assistance         General ADL Comments:  (Pt. was ed. on use of AE for LE ADLs. )     Vision                 Additional Comments:  (Pt. reports she had lasisk sx. )   Perception     Praxis      Pertinent Vitals/Pain Pain Assessment: 0-10 Pain Score: 5  Pain Location: Right hip Pain Descriptors / Indicators: Throbbing Pain Intervention(s): Monitored during session;Premedicated before session     Hand Dominance Right   Extremity/Trunk Assessment Upper Extremity Assessment Upper Extremity Assessment: Overall WFL for tasks assessed   Lower Extremity Assessment Lower Extremity Assessment: RLE deficits/detail RLE Deficits / Details: Decreased strength and ROM as expected post-op - poor hip flexion functionally with ambulation RLE: Unable to fully assess due to pain;Unable to fully assess due to immobilization       Communication Communication Communication: No difficulties  Cognition Arousal/Alertness: Awake/alert Behavior During Therapy: WFL for tasks assessed/performed Overall Cognitive Status: Within Functional Limits for tasks assessed                     General Comments       Exercises Exercises: Total Joint     Shoulder Instructions      Home Living Family/patient expects to be discharged to:: Private residence Living Arrangements: Other (Comment);Spouse/significant other (Daughter-in-law) Available Help at  Discharge: Family;Available 24 hours/day Type of Home: House Home Access: Stairs to enter [employer] of Steps: 3 Entrance Stairs-Rails: Right;Left Home Layout: Two level;Laundry or work area in basement;Able to live on main level with Financial controller: Standard Bathroom Accessibility: Yes How Accessible: Accessible via walker Home Equipment: Moro - 2 wheels;Toilet riser          Prior Functioning/Environment Level of Independence: Needs assistance  Gait / Transfers Assistance Needed: Cane for ambulation ADL's / Homemaking Assistance Needed: Needed assist for dressing lower body        OT Diagnosis: Generalized weakness;Acute pain   OT Problem List: Decreased activity tolerance;Decreased knowledge of use of DME or AE;Decreased knowledge of precautions   OT Treatment/Interventions: Self-care/ADL training;DME and/or AE instruction;Therapeutic activities;Patient/family education    OT Goals(Current goals can be found in the care plan section) Acute Rehab OT Goals Patient Stated Goal:  (go home) OT Goal Formulation: With patient Time For Goal Achievement: 12/27/13 Potential to Achieve Goals: Good ADL Goals Pt Will Perform Lower Body Bathing: with supervision;with adaptive equipment Pt Will Perform Lower Body Dressing: with supervision;with adaptive equipment;sit to/from stand Pt Will Transfer to Toilet: with supervision Pt Will Perform Toileting - Clothing Manipulation and hygiene: with supervision;sit to/from stand  OT Frequency: Min 2X/week   Barriers to D/C: Decreased caregiver support  Pt. husband is elderly       Co-evaluation              End of Session    Activity Tolerance: Patient tolerated treatment well Patient left: in chair;with call bell/phone within reach   Time: 5859-2924 OT Time Calculation (min): 41 min Charges:  OT General Charges $OT Visit: 1 Procedure OT Evaluation $Initial OT Evaluation Tier I: 1  Procedure OT Treatments $Self Care/Home Management : 23-37 mins G-Codes:    Erin Lutz December 20, 2013, 10:38 AM

## 2013-12-14 LAB — CBC
HEMATOCRIT: 32.3 % — AB (ref 36.0–46.0)
Hemoglobin: 10.4 g/dL — ABNORMAL LOW (ref 12.0–15.0)
MCH: 30.3 pg (ref 26.0–34.0)
MCHC: 32.2 g/dL (ref 30.0–36.0)
MCV: 94.2 fL (ref 78.0–100.0)
Platelets: 166 10*3/uL (ref 150–400)
RBC: 3.43 MIL/uL — AB (ref 3.87–5.11)
RDW: 14 % (ref 11.5–15.5)
WBC: 10.1 10*3/uL (ref 4.0–10.5)

## 2013-12-14 NOTE — Progress Notes (Signed)
Occupational Therapy Treatment Patient Details Name: Erin Lutz MRN: [id number] DOB: Dec 03, 1925 Today's Date: 12/14/2013    History of present illness 80 y.o. female s/p right total hip arthroplasty via posterior approach.   OT comments  Pt seen for acute OT session. Pt progressing well towards acute OT goals as detailed below.   Follow Up Recommendations  Supervision/Assistance - 24 hour;No OT follow up    Equipment Recommendations  None recommended by OT    Recommendations for Other Services      Precautions / Restrictions Precautions Precautions: Posterior Hip Precaution Comments: Reviewed posterior hip precautions Restrictions Weight Bearing Restrictions: Yes RLE Weight Bearing: Weight bearing as tolerated       Mobility Bed Mobility Overal bed mobility: Needs Assistance Bed Mobility: Rolling;Sidelying to Sit Rolling: Min assist Sidelying to sit: Min assist       General bed mobility comments: pt in recliner  Transfers Overall transfer level: Needs assistance Equipment used: Rolling walker (2 wheeled) Transfers: Sit to/from Stand Sit to Stand: Min guard         General transfer comment: from recliner, good technique    Balance Overall balance assessment: Needs assistance Sitting-balance support: No upper extremity supported;Feet supported Sitting balance-Leahy Scale: Good     Standing balance support: Bilateral upper extremity supported;During functional activity Standing balance-Leahy Scale: Poor                     ADL Overall ADL's : Needs assistance/impaired                                     Functional mobility during ADLs: Min guard General ADL Comments: Pt completed sit<> stand and stand pivot transfers at min guard level. Able to tolerate standing at sink to complete grooming tasks and house-hold distance ambulation. Educated on AE and techniques for ADLs at home.       Vision                      Perception     Praxis      Cognition   Behavior During Therapy: WFL for tasks assessed/performed Overall Cognitive Status: Within Functional Limits for tasks assessed                       Extremity/Trunk Assessment               Exercises   Shoulder Instructions       General Comments      Pertinent Vitals/ Pain       Pain Assessment: 0-10 Pain Score: 4  Pain Location: R hip Pain Descriptors / Indicators: Throbbing Pain Intervention(s): Limited activity within patient's tolerance;Monitored during session  Home Living                                          Prior Functioning/Environment              Frequency Min 2X/week     Progress Toward Goals  OT Goals(current goals can now be found in the care plan section)  Progress towards OT goals: Progressing toward goals  Acute Rehab OT Goals Patient Stated Goal: Go home OT Goal Formulation: With patient Time For Goal Achievement: 12/27/13 Potential to Achieve Goals: Good ADL Goals  Pt Will Perform Lower Body Bathing: with supervision;with adaptive equipment Pt Will Perform Lower Body Dressing: with supervision;with adaptive equipment;sit to/from stand Pt Will Transfer to Toilet: with supervision Pt Will Perform Toileting - Clothing Manipulation and hygiene: with supervision;sit to/from stand  Plan Discharge plan remains appropriate    Co-evaluation                 End of Session Equipment Utilized During Treatment: Gait belt;Rolling walker   Activity Tolerance Patient tolerated treatment well   Patient Left in chair;with call bell/phone within reach   Nurse Communication          Time: 1413-1436 OT Time Calculation (min): 23 min  Charges: OT General Charges $OT Visit: 1 Procedure OT Treatments $Self Care/Home Management : 23-37 mins  Hortencia Pilar 12/14/2013, 2:46 PM

## 2013-12-14 NOTE — Plan of Care (Signed)
Problem: Consults Goal: Diagnosis- Total Joint Replacement Primary Total Hip     

## 2013-12-14 NOTE — Progress Notes (Signed)
PATIENT ID: Erin Lutz  MRN: [id number]  DOB/AGE:  July 23, 1925 / 80 y.o.  2 Days Post-Op Procedure(s) (LRB): RIGHT TOTAL HIP ARTHROPLASTY (Right)    PROGRESS NOTE Subjective: Patient is alert, oriented, no Nausea, no Vomiting, yes passing gas, no Bowel Movement. Taking PO well. Denies SOB, Chest or Calf Pain. Using Incentive Spirometer, PAS in place. Ambulate WBAT Patient reports pain as mild  .    Objective: Vital signs in last 24 hours: Filed Vitals:   12/13/13 2032 12/14/13 0000 12/14/13 0400 12/14/13 0537  BP: 142/53   139/62  Pulse: 113   93  Temp: 98.5 F (36.9 C)   98.2 F (36.8 C)  TempSrc:      Resp: 16 16 14 16   Weight:      SpO2: 92% 93% 94% 92%      Intake/Output from previous day: I/O last 3 completed shifts: In: 960 [P.O.:720; I.V.:240] Out: -    Intake/Output this shift:     LABORATORY DATA:  Recent Labs  12/13/13 0452 12/14/13 0519  WBC 8.3 10.1  HGB 11.1* 10.4*  HCT 34.3* 32.3*  PLT 183 166  NA 135*  --   K 4.0  --   CL 98  --   CO2 24  --   BUN 9  --   CREATININE 0.73  --   GLUCOSE 119*  --   CALCIUM 8.2*  --     Examination: Neurologically intact Neurovascular intact Sensation intact distally Intact pulses distally Dorsiflexion/Plantar flexion intact Incision: dressing C/D/I No cellulitis present Compartment soft} XR AP&Lat of hip shows well placed\fixed THA  Assessment:   2 Days Post-Op Procedure(s) (LRB): RIGHT TOTAL HIP ARTHROPLASTY (Right) ADDITIONAL DIAGNOSIS:  Expected Acute Blood Loss Anemia  Plan: PT/OT WBAT, THA  posterior precautions  DVT Prophylaxis: SCDx72 hrs, ASA 325 mg BID x 2 weeks  DISCHARGE PLAN: Home, when pt passes therapy goals.   Daughter will assist with home needs  DISCHARGE NEEDS: HHPT, HHRN, Walker and 3-in-1 comode seat

## 2013-12-14 NOTE — Progress Notes (Signed)
Physical Therapy Treatment Patient Details Name: Erin Lutz MRN: [id number] DOB: 04-May-1925 Today's Date: 12/14/2013    History of Present Illness 80 y.o. female s/p right total hip arthroplasty via posterior approach.    PT Comments    Patient continues to progress very well towards physical therapy goals. She has safely completed stair training with family present who actively participated in therapy session this afternoon. Ambulating at supervision level up to 120 feet while using a rolling walker. Husband is capable of assisting her with bed mobility as needed. Feel she is adequate for d/c from a mobility standpoint. Patient will continue to benefit from skilled physical therapy services at home with HHPT to further improve independence with functional mobility.   Follow Up Recommendations  Home health PT;Supervision for mobility/OOB     Equipment Recommendations  3in1 (PT)    Recommendations for Other Services OT consult     Precautions / Restrictions Precautions Precautions: Posterior Hip Precaution Comments: Reviewed posterior hip precautions. Restrictions Weight Bearing Restrictions: Yes RLE Weight Bearing: Weight bearing as tolerated    Mobility  Bed Mobility Overal bed mobility: Needs Assistance Bed Mobility: Sit to Supine       Sit to supine: Min assist   General bed mobility comments: Min assist for RLE support into bed. VC for instructions with husband present and actively participating in bed mobility training.   Transfers Overall transfer level: Needs assistance Equipment used: Rolling walker (2 wheeled) Transfers: Sit to/from Stand Sit to Stand: Min guard         General transfer comment: Min guard for safety from recliner. No cues needed for hand placement.  Ambulation/Gait Ambulation/Gait assistance: Supervision Ambulation Distance (Feet): 120 Feet Assistive device: Rolling walker (2 wheeled) Gait Pattern/deviations: Step-to  pattern;Step-through pattern;Decreased step length - left;Decreased stance time - right;Antalgic   Gait velocity interpretation: Below normal speed for age/gender General Gait Details: Focused on step-through gait pattern with upright posture and intermittent cues for forward gaze. Supervision for safety. Progressed to continuous pushing of walker.   Stairs   Stairs assistance: Min assist Stair Management: No rails;Step to pattern;Backwards;With walker Number of Stairs: 2 (x2) General stair comments: Min assist to block rolling walker. Husband and son present and actively participated in stair training. VC for sequencing. Pt able to correctly state on second trail correct technique. States she feels confident with this task and has no further questions.  Wheelchair Mobility    Modified Rankin (Stroke Patients Only)       Balance Overall balance assessment: Needs assistance Sitting-balance support: No upper extremity supported;Feet supported Sitting balance-Leahy Scale: Good     Standing balance support: Bilateral upper extremity supported;During functional activity Standing balance-Leahy Scale: Poor                      Cognition Arousal/Alertness: Awake/alert Behavior During Therapy: WFL for tasks assessed/performed Overall Cognitive Status: Within Functional Limits for tasks assessed                      Exercises Total Joint Exercises Ankle Circles/Pumps: AROM;Both;10 reps;Seated Quad Sets: Strengthening;Both;10 reps;Seated Heel Slides: AAROM;Right;10 reps;Seated Hip ABduction/ADduction: AAROM;Right;10 reps;Supine    General Comments General comments (skin integrity, edema, etc.): Recalls 2/3 precautions, reviewed handout again.      Pertinent Vitals/Pain Pain Assessment: No/denies pain Pain Score: 4  Pain Location: Rt hip Pain Descriptors / Indicators: Throbbing Pain Intervention(s): Monitored during session;Repositioned    Home Living  Prior Function            PT Goals (current goals can now be found in the care plan section) Acute Rehab PT Goals Patient Stated Goal: Go home PT Goal Formulation: With patient Time For Goal Achievement: 12/20/13 Potential to Achieve Goals: Good Progress towards PT goals: Progressing toward goals    Frequency  7X/week    PT Plan Current plan remains appropriate    Co-evaluation             End of Session   Activity Tolerance: Patient tolerated treatment well Patient left: with call bell/phone within reach;in bed;with family/visitor present     Time: 5638-7564 PT Time Calculation (min): 30 min  Charges:  $Gait Training: 8-22 mins $Therapeutic Exercise: 8-22 mins                    G Codes:      [employer], [place]  Ellouise Newer 12/14/2013, 4:17 PM

## 2013-12-14 NOTE — Progress Notes (Signed)
Physical Therapy Treatment Patient Details Name: Erin Lutz MRN: [id number] DOB: [birth year] Today's Date: 12/14/2013    History of Present Illness 80 y.o. female s/p right total hip arthroplasty via posterior approach.    PT Comments    Patient with remarkable progress today, ambulating up to 80 feet with a rolling walker and was able to safely navigate 2 steps with minimal assistance. Feel she will be safe for d/c home from a mobility standpoint tomorrow if medically ready. Patient will continue to benefit from skilled physical therapy services to further improve independence with functional mobility. Updated d/c recommendation for HHPT as she will greatly benefit from further PT services at home.   Follow Up Recommendations  Home health PT;Supervision for mobility/OOB     Equipment Recommendations  3in1 (PT)    Recommendations for Other Services OT consult     Precautions / Restrictions Precautions Precautions: Posterior Hip Precaution Comments: Reviewed posterior hip precautions. Restrictions Weight Bearing Restrictions: Yes RLE Weight Bearing: Weight bearing as tolerated    Mobility  Bed Mobility Overal bed mobility: Needs Assistance Bed Mobility: Rolling;Sidelying to Sit Rolling: Min assist Sidelying to sit: Min assist       General bed mobility comments: Pt with much better independence with log roll towards her left side, pillow placed between knees to maintain hip precautions. Min assist for roll with no use of rail, and Min assist for trunk support to seated position. Tolerates this much better than supine<>sit towards Rt side. Educated on technique with VC throughout.  Transfers Overall transfer level: Needs assistance Equipment used: Rolling walker (2 wheeled) Transfers: Sit to/from Stand Sit to Stand: Min guard         General transfer comment: Min guard for safety from lowest bed setting and reclining chair. Required extra time and VC for hand  placement but no physical assist to perform  Ambulation/Gait Ambulation/Gait assistance: Supervision Ambulation Distance (Feet): 80 Feet Assistive device: Rolling walker (2 wheeled) Gait Pattern/deviations: Step-to pattern;Decreased step length - right;Decreased step length - left;Decreased stance time - right;Antalgic   Gait velocity interpretation: Below normal speed for age/gender General Gait Details: Improved right foot clearance with swing limb advancement. Focused on upright posture and VC for increased stride length and Rt foot clearance.   Stairs Stairs: Yes Stairs assistance: Min assist Stair Management: No rails;Backwards;Step to pattern;With walker Number of Stairs: 2 General stair comments: Min assist to block rolling walker. Frequent verbal cues for technique and sequencing. Able to perform this task without knees buckling or pysical assist other than to block walker on steps.  Wheelchair Mobility    Modified Rankin (Stroke Patients Only)       Balance                                    Cognition Arousal/Alertness: Awake/alert Behavior During Therapy: WFL for tasks assessed/performed Overall Cognitive Status: Within Functional Limits for tasks assessed                      Exercises Total Joint Exercises Ankle Circles/Pumps: AROM;Both;10 reps;Seated Quad Sets: Strengthening;Both;10 reps;Seated Gluteal Sets: Strengthening;Both;10 reps;Seated    General Comments        Pertinent Vitals/Pain Pain Assessment: 0-10 Pain Score: 5  Pain Location: Rt hip Pain Intervention(s): Limited activity within patient's tolerance;Monitored during session;Repositioned    Home Living  Prior Function            PT Goals (current goals can now be found in the care plan section) Acute Rehab PT Goals Patient Stated Goal: Go home PT Goal Formulation: With patient Time For Goal Achievement: 12/20/13 Potential to  Achieve Goals: Good Progress towards PT goals: Progressing toward goals    Frequency  7X/week    PT Plan Discharge plan needs to be updated    Co-evaluation             End of Session   Activity Tolerance: Patient tolerated treatment well Patient left: in chair;with call bell/phone within reach     Time: 9458-5929 PT Time Calculation (min): 27 min  Charges:  $Gait Training: 8-22 mins $Therapeutic Activity: 8-22 mins                    G Codes:      [employer], [place]  Ellouise Newer 12/14/2013, 1:12 PM

## 2013-12-14 NOTE — Progress Notes (Signed)
12/14/13 Set up by MD office for HHPT with [facility]. Spoke with patient, no change in d/c plan. Patient's husband, son  and daughter in law will be available to assist her after discharge. Patient states that she has a rolling walker at home. Contacted Frank at [facility] and requested 3N1 be delivered to patient's room. Confirmed with Stanton Kidney at [place] that patient set up for  [facility] . Fuller Plan RN, BSN, CCM

## 2013-12-15 ENCOUNTER — Encounter (HOSPITAL_COMMUNITY): Payer: Self-pay | Admitting: Orthopedic Surgery

## 2013-12-15 LAB — CBC
HCT: 30 % — ABNORMAL LOW (ref 36.0–46.0)
Hemoglobin: 9.8 g/dL — ABNORMAL LOW (ref 12.0–15.0)
MCH: 31 pg (ref 26.0–34.0)
MCHC: 32.7 g/dL (ref 30.0–36.0)
MCV: 94.9 fL (ref 78.0–100.0)
PLATELETS: 159 10*3/uL (ref 150–400)
RBC: 3.16 MIL/uL — AB (ref 3.87–5.11)
RDW: 13.7 % (ref 11.5–15.5)
WBC: 7.5 10*3/uL (ref 4.0–10.5)

## 2013-12-15 NOTE — Progress Notes (Signed)
Physical Therapy Treatment Patient Details Name: Erin Lutz MRN: [id number] DOB: 1925/11/30 Today's Date: 12/15/2013    History of Present Illness 79 y.o. female s/p right total hip arthroplasty via posterior approach.    PT Comments    Patient continues to progress well towards physical therapy goals, ambulating up to 150 feet this AM with supervision while using a rolling walker. Tolerating therapeutic exercises well. She has no further questions concerning mobility and will have 24 hour care at home. Feel she is adequate for d/c from a mobility standpoint.   Follow Up Recommendations  Home health PT;Supervision for mobility/OOB     Equipment Recommendations  3in1 (PT)    Recommendations for Other Services OT consult     Precautions / Restrictions Precautions Precautions: Posterior Hip Precaution Comments: Reviewed posterior hip precautions. Restrictions Weight Bearing Restrictions: Yes RLE Weight Bearing: Weight bearing as tolerated    Mobility  Bed Mobility                  Transfers Overall transfer level: Needs assistance Equipment used: Rolling walker (2 wheeled) Transfers: Sit to/from Stand Sit to Stand: Min guard         General transfer comment: Min guard for safety. Demonstrates correct hand placement on stable surface. Performed from recliner. Good control with descent  Ambulation/Gait Ambulation/Gait assistance: Supervision Ambulation Distance (Feet): 150 Feet Assistive device: Rolling walker (2 wheeled) Gait Pattern/deviations: Step-through pattern;Decreased stride length;Decreased stance time - right;Decreased step length - right;Decreased step length - left   Gait velocity interpretation: Below normal speed for age/gender General Gait Details: Instructed on greater stride length and step symmetry with VC for forward gaze intermittently. No loss of balance and demonstrates good control of RW.   Stairs            Wheelchair  Mobility    Modified Rankin (Stroke Patients Only)       Balance                                    Cognition Arousal/Alertness: Awake/alert Behavior During Therapy: WFL for tasks assessed/performed Overall Cognitive Status: Within Functional Limits for tasks assessed                      Exercises Total Joint Exercises Ankle Circles/Pumps: AROM;Both;10 reps;Seated Quad Sets: Strengthening;Both;10 reps;Seated Gluteal Sets: Strengthening;Both;10 reps;Seated Long Arc Quad: Right;10 reps;Seated;Strengthening Marching in Standing: AROM;Right;10 reps;Seated (in sitting)    General Comments        Pertinent Vitals/Pain Pain Assessment: No/denies pain Pain Intervention(s): Monitored during session    Home Living                      Prior Function            PT Goals (current goals can now be found in the care plan section) Acute Rehab PT Goals PT Goal Formulation: With patient Time For Goal Achievement: 12/20/13 Potential to Achieve Goals: Good Progress towards PT goals: Progressing toward goals    Frequency  7X/week    PT Plan Current plan remains appropriate    Co-evaluation             End of Session   Activity Tolerance: Patient tolerated treatment well Patient left: with call bell/phone within reach;in chair     Time: 8527-7824 PT Time Calculation (min): 18 min  Charges:  $Gait Training:  8-22 mins                    G Codes:      Erin Lutz, Erin Lutz  Erin Lutz 12/15/2013, 8:57 AM

## 2013-12-15 NOTE — Progress Notes (Signed)
CARE MANAGEMENT NOTE 12/15/2013  Patient:  Erin Lutz, Erin Lutz   Account Number:  [id number]  Date Initiated:  12/14/2013  Documentation initiated by:  [facility]  Subjective/Objective Assessment:   admitted s/p rt TKA     Action/Plan:   Pt/Ot evals-recommending HHPT   Anticipated DC Date:  12/15/2013   Anticipated DC Plan:  [place]  CM consult      Choice offered to / List presented to:  C-1 Patient   DME arranged  3-N-1      DME agency  [place] arranged  [place]   Status of service:  Completed, signed off Medicare Important Message given?  YES (If response is "NO", the following Medicare IM given date fields will be blank) Date Medicare IM given:  12/15/2013 Medicare IM given by:  [facility] Date Additional Medicare IM given:   Additional Medicare IM given by:    Discharge Disposition:  [place]  Per UR Regulation:    If discussed at Long Length of Stay Meetings, dates discussed:    Comments:  12/14/13 Set up by MD office for HHPT with [facility]. Spoke with patient, no change in d/c plan. Patient's husband, son  and daughter in law will be available to assist her after discharge. Patient states that she has a rolling walker at home. Contacted Frank at [facility] and requested 3N1 be delivered to patient's room. Confirmed with Stanton Kidney at [place] that patient set up for  [place] . Fuller Plan RN, BSN, CCM

## 2013-12-15 NOTE — Discharge Summary (Signed)
Patient ID: Erin Lutz MRN: [id number] DOB/AGE: 04/22/1925 80 y.o.  Admit date: 12/12/2013 Discharge date: 12/15/2013  Admission Diagnoses:  Principal Problem:   Arthritis of right hip Active Problems:   Arthritis, hip   Discharge Diagnoses:  Same  Past Medical History  Diagnosis Date  . Hyperlipidemia     takes Vytorin every other day  . Cancer     breast  . Pneumonia 66 yrs ago  . History of bronchitis 38yrs ago  . History of migraine 61yrs ago  . Numbness     bilateral feet  . Arthritis   . Joint pain   . Joint swelling   . Osteoporosis   . History of colon polyps   . Urinary frequency   . Nocturia     Surgeries: Procedure(s): RIGHT TOTAL HIP ARTHROPLASTY on 12/12/2013   Consultants:    Discharged Condition: Improved  Hospital Course: Erin Lutz is an 80 y.o. female who was admitted 12/12/2013 for operative treatment ofArthritis of right hip. Patient has severe unremitting pain that affects sleep, daily activities, and work/hobbies. After pre-op clearance the patient was taken to the operating room on 12/12/2013 and underwent  Procedure(s): RIGHT TOTAL HIP ARTHROPLASTY.    Patient was given perioperative antibiotics: Anti-infectives   Start     Dose/Rate Route Frequency Ordered Stop   12/12/13 0942  cefUROXime (ZINACEF) injection  Status:  Discontinued       As needed 12/12/13 0942 12/12/13 1102   12/12/13 0600  ceFAZolin (ANCEF) IVPB 2 g/50 mL premix     2 g 100 mL/hr over 30 Minutes Intravenous On call to O.R. 12/11/13 1351 12/12/13 0900       Patient was given sequential compression devices, early ambulation, and chemoprophylaxis to prevent DVT.  Patient benefited maximally from hospital stay and there were no complications.  Prior to discharge her dressing was clean and dry with no drainage, she walked 150 feet, she mastered going up and down forceps.  Recent vital signs: Patient Vitals for the past 24 hrs:  BP Temp Temp src Pulse Resp SpO2  Height Weight  12/15/13 0525 141/52 mmHg 98.4 F (36.9 C) Oral 89 16 98 % - -  12/15/13 0400 - - - - 14 99 % - -  12/15/13 0100 - - - - - - 5\' 2"  (1.575 m) 64.411 kg (142 lb)  12/15/13 0000 - - - - 16 100 % - -  12/14/13 2101 144/54 mmHg 98.7 F (37.1 C) Oral 96 18 100 % - -  12/14/13 2000 - - - - 16 98 % - -  12/14/13 1300 118/48 mmHg 98.2 F (36.8 C) - 100 18 95 % - -     Recent laboratory studies:  Recent Labs  12/13/13 0452 12/14/13 0519 12/15/13 0510  WBC 8.3 10.1 7.5  HGB 11.1* 10.4* 9.8*  HCT 34.3* 32.3* 30.0*  PLT 183 166 159  NA 135*  --   --   K 4.0  --   --   CL 98  --   --   CO2 24  --   --   BUN 9  --   --   CREATININE 0.73  --   --   GLUCOSE 119*  --   --   CALCIUM 8.2*  --   --      Discharge Medications:     Medication List    STOP taking these medications       acetaminophen 500 MG tablet  Commonly known as:  TYLENOL     oxyCODONE 5 MG immediate release tablet  Commonly known as:  Oxy IR/ROXICODONE      TAKE these medications       aspirin EC 325 MG tablet  Take 1 tablet (325 mg total) by mouth 2 (two) times daily.     ezetimibe-simvastatin 10-20 MG per tablet  Commonly known as:  VYTORIN  Take 1 tablet by mouth every other day.     methocarbamol 500 MG tablet  Commonly known as:  ROBAXIN  Take 1 tablet (500 mg total) by mouth 2 (two) times daily with a meal.     oxyCODONE-acetaminophen 5-325 MG per tablet  Commonly known as:  ROXICET  Take 1 tablet by mouth every 4 (four) hours as needed.        Diagnostic Studies: Dg Chest 2 View  12/02/2013   CLINICAL DATA:  Preoperative evaluation for right hip surgery  EXAM: CHEST  2 VIEW  COMPARISON:  None.  FINDINGS: Cardiac shadow is within normal limits. The lungs are hyperinflated consistent with COPD. No focal infiltrate or sizable effusion is seen. A vague density is noted in the medial aspect of the left lung apex. Lordotic views of the chest are recommended for further evaluation.   IMPRESSION: Questionable nodular density in the medial left lung apex. Lordotic views are recommended for further evaluation.  These results will be called to the ordering clinician or representative by the Radiologist Assistant, and communication documented in the PACS or zVision Dashboard.   Electronically Signed   By: Inez Catalina M.D.   On: 12/02/2013 15:08   Dg Pelvis Portable  12/12/2013   CLINICAL DATA:  Postoperative right hip.  EXAM: PORTABLE PELVIS 1-2 VIEWS  COMPARISON:  None.  FINDINGS: Patient status post right hip replacement without malalignment. Postoperative changes including air within the hip joint space is noted. No acute fracture dislocation is noted. Degenerative joint changes of left hip with narrowed joint space is noted.  IMPRESSION: Postoperative right hip replacement without malalignment.   Electronically Signed   By: Abelardo Diesel M.D.   On: 12/12/2013 11:47    Disposition: Final discharge disposition not confirmed      Discharge Instructions   Call MD / Call 911    Complete by:  As directed   If you experience chest pain or shortness of breath, CALL 911 and be transported to the hospital emergency room.  If you develope a fever above 101 F, pus (white drainage) or increased drainage or redness at the wound, or calf pain, call your surgeon's office.     Change dressing    Complete by:  As directed   You may change your dressing on prn, then change the dressing daily with sterile 4 x 4 inch gauze dressing and paper tape.  You may clean the incision with alcohol prior to redressing     Constipation Prevention    Complete by:  As directed   Drink plenty of fluids.  Prune juice may be helpful.  You may use a stool softener, such as Colace (over the counter) 100 mg twice a day.  Use MiraLax (over the counter) for constipation as needed.     Diet - low sodium heart healthy    Complete by:  As directed      Follow the hip precautions as taught in Physical Therapy     Complete by:  As directed      Increase activity slowly as tolerated  Complete by:  As directed            Follow-up Information   Follow up with Kerin Salen, MD In 2 weeks.   Specialty:  Orthopedic Surgery   Contact information:   [place] [id number] [phone]       Follow up with [facility]. (They will contact you to schedule home physical and occupational therapy visits.)    Contact information:   [address] [address] [phone]       Follow up with Kerin Salen, MD In 1 week.   Specialty:  Orthopedic Surgery   Contact information:   [place] [address] [phone]        Signed: Kerin Salen 12/15/2013, 7:30 AM

## 2013-12-15 NOTE — Progress Notes (Signed)
Patient ID: Erin Lutz, female   DOB: Oct 03, 1925, 80 y.o.   MRN: [id number] PATIENT ID: Erin Lutz  MRN: [id number]  DOB/AGE:  05-31-1925 / 80 y.o.  3 Days Post-Op Procedure(s) (LRB): RIGHT TOTAL HIP ARTHROPLASTY (Right)    PROGRESS NOTE Subjective: Patient is alert, oriented, no Nausea, no Vomiting, yes passing gas, 1 Bowel Movement. Taking PO well. Denies SOB, Chest or Calf Pain. Using Incentive Spirometer, PAS in place. Ambulate 150 feet, has done steps with PT, patient would like to go home Patient reports pain as 2 on 0-10 scale  .    Objective: Vital signs in last 24 hours: Filed Vitals:   12/15/13 0000 12/15/13 0100 12/15/13 0400 12/15/13 0525  BP:    141/52  Pulse:    89  Temp:    98.4 F (36.9 C)  TempSrc:    Oral  Resp: 16  14 16   Height:  5\' 2"  (1.575 m)    Weight:  64.411 kg (142 lb)    SpO2: 100%  99% 98%      Intake/Output from previous day: I/O last 3 completed shifts: In: 960 [P.O.:720; I.V.:240] Out: -    Intake/Output this shift:     LABORATORY DATA:  Recent Labs  12/13/13 0452 12/14/13 0519 12/15/13 0510  WBC 8.3 10.1 7.5  HGB 11.1* 10.4* 9.8*  HCT 34.3* 32.3* 30.0*  PLT 183 166 159  NA 135*  --   --   K 4.0  --   --   CL 98  --   --   CO2 24  --   --   BUN 9  --   --   CREATININE 0.73  --   --   GLUCOSE 119*  --   --   CALCIUM 8.2*  --   --     Examination: Neurologically intact ABD soft Neurovascular intact Sensation intact distally Intact pulses distally Dorsiflexion/Plantar flexion intact Incision: dressing C/D/I No cellulitis present Compartment soft} XR AP&Lat of hip shows well placed\fixed THA  Assessment:   3 Days Post-Op Procedure(s) (LRB): RIGHT TOTAL HIP ARTHROPLASTY (Right) ADDITIONAL DIAGNOSIS:  Expected Acute Blood Loss Anemia,   Plan: PT/OT WBAT, THA  posterior precautions  DVT Prophylaxis: SCDx72 hrs, ASA 325 mg BID x 2 weeks  DISCHARGE PLAN: Home, today  DISCHARGE NEEDS: HHPT, HHRN, CPM, Walker  and 3-in-1 comode seat

## 2013-12-16 DIAGNOSIS — Z96641 Presence of right artificial hip joint: Secondary | ICD-10-CM | POA: Diagnosis not present

## 2013-12-16 DIAGNOSIS — Z853 Personal history of malignant neoplasm of breast: Secondary | ICD-10-CM | POA: Diagnosis not present

## 2013-12-16 DIAGNOSIS — R202 Paresthesia of skin: Secondary | ICD-10-CM | POA: Diagnosis not present

## 2013-12-16 DIAGNOSIS — Z471 Aftercare following joint replacement surgery: Secondary | ICD-10-CM | POA: Diagnosis not present

## 2013-12-16 DIAGNOSIS — M810 Age-related osteoporosis without current pathological fracture: Secondary | ICD-10-CM | POA: Diagnosis not present

## 2013-12-19 DIAGNOSIS — Z96641 Presence of right artificial hip joint: Secondary | ICD-10-CM | POA: Diagnosis not present

## 2013-12-19 DIAGNOSIS — Z471 Aftercare following joint replacement surgery: Secondary | ICD-10-CM | POA: Diagnosis not present

## 2013-12-19 DIAGNOSIS — Z853 Personal history of malignant neoplasm of breast: Secondary | ICD-10-CM | POA: Diagnosis not present

## 2013-12-19 DIAGNOSIS — R202 Paresthesia of skin: Secondary | ICD-10-CM | POA: Diagnosis not present

## 2013-12-19 DIAGNOSIS — M810 Age-related osteoporosis without current pathological fracture: Secondary | ICD-10-CM | POA: Diagnosis not present

## 2013-12-20 DIAGNOSIS — R202 Paresthesia of skin: Secondary | ICD-10-CM | POA: Diagnosis not present

## 2013-12-20 DIAGNOSIS — Z96641 Presence of right artificial hip joint: Secondary | ICD-10-CM | POA: Diagnosis not present

## 2013-12-20 DIAGNOSIS — M810 Age-related osteoporosis without current pathological fracture: Secondary | ICD-10-CM | POA: Diagnosis not present

## 2013-12-20 DIAGNOSIS — Z471 Aftercare following joint replacement surgery: Secondary | ICD-10-CM | POA: Diagnosis not present

## 2013-12-20 DIAGNOSIS — Z853 Personal history of malignant neoplasm of breast: Secondary | ICD-10-CM | POA: Diagnosis not present

## 2013-12-21 DIAGNOSIS — M810 Age-related osteoporosis without current pathological fracture: Secondary | ICD-10-CM | POA: Diagnosis not present

## 2013-12-21 DIAGNOSIS — Z96641 Presence of right artificial hip joint: Secondary | ICD-10-CM | POA: Diagnosis not present

## 2013-12-21 DIAGNOSIS — R202 Paresthesia of skin: Secondary | ICD-10-CM | POA: Diagnosis not present

## 2013-12-21 DIAGNOSIS — Z853 Personal history of malignant neoplasm of breast: Secondary | ICD-10-CM | POA: Diagnosis not present

## 2013-12-21 DIAGNOSIS — Z471 Aftercare following joint replacement surgery: Secondary | ICD-10-CM | POA: Diagnosis not present

## 2013-12-23 DIAGNOSIS — Z471 Aftercare following joint replacement surgery: Secondary | ICD-10-CM | POA: Diagnosis not present

## 2013-12-23 DIAGNOSIS — M810 Age-related osteoporosis without current pathological fracture: Secondary | ICD-10-CM | POA: Diagnosis not present

## 2013-12-23 DIAGNOSIS — R202 Paresthesia of skin: Secondary | ICD-10-CM | POA: Diagnosis not present

## 2013-12-23 DIAGNOSIS — Z96641 Presence of right artificial hip joint: Secondary | ICD-10-CM | POA: Diagnosis not present

## 2013-12-23 DIAGNOSIS — Z853 Personal history of malignant neoplasm of breast: Secondary | ICD-10-CM | POA: Diagnosis not present

## 2013-12-26 DIAGNOSIS — Z471 Aftercare following joint replacement surgery: Secondary | ICD-10-CM | POA: Diagnosis not present

## 2013-12-26 DIAGNOSIS — M810 Age-related osteoporosis without current pathological fracture: Secondary | ICD-10-CM | POA: Diagnosis not present

## 2013-12-26 DIAGNOSIS — Z853 Personal history of malignant neoplasm of breast: Secondary | ICD-10-CM | POA: Diagnosis not present

## 2013-12-26 DIAGNOSIS — R202 Paresthesia of skin: Secondary | ICD-10-CM | POA: Diagnosis not present

## 2013-12-26 DIAGNOSIS — Z96641 Presence of right artificial hip joint: Secondary | ICD-10-CM | POA: Diagnosis not present

## 2013-12-27 DIAGNOSIS — M1610 Unilateral primary osteoarthritis, unspecified hip: Secondary | ICD-10-CM | POA: Diagnosis not present

## 2013-12-28 DIAGNOSIS — R202 Paresthesia of skin: Secondary | ICD-10-CM | POA: Diagnosis not present

## 2013-12-28 DIAGNOSIS — M810 Age-related osteoporosis without current pathological fracture: Secondary | ICD-10-CM | POA: Diagnosis not present

## 2013-12-28 DIAGNOSIS — Z853 Personal history of malignant neoplasm of breast: Secondary | ICD-10-CM | POA: Diagnosis not present

## 2013-12-28 DIAGNOSIS — Z471 Aftercare following joint replacement surgery: Secondary | ICD-10-CM | POA: Diagnosis not present

## 2013-12-28 DIAGNOSIS — Z96641 Presence of right artificial hip joint: Secondary | ICD-10-CM | POA: Diagnosis not present

## 2013-12-30 DIAGNOSIS — M810 Age-related osteoporosis without current pathological fracture: Secondary | ICD-10-CM | POA: Diagnosis not present

## 2013-12-30 DIAGNOSIS — Z96641 Presence of right artificial hip joint: Secondary | ICD-10-CM | POA: Diagnosis not present

## 2013-12-30 DIAGNOSIS — R202 Paresthesia of skin: Secondary | ICD-10-CM | POA: Diagnosis not present

## 2013-12-30 DIAGNOSIS — Z853 Personal history of malignant neoplasm of breast: Secondary | ICD-10-CM | POA: Diagnosis not present

## 2013-12-30 DIAGNOSIS — Z471 Aftercare following joint replacement surgery: Secondary | ICD-10-CM | POA: Diagnosis not present

## 2014-01-02 DIAGNOSIS — M810 Age-related osteoporosis without current pathological fracture: Secondary | ICD-10-CM | POA: Diagnosis not present

## 2014-01-02 DIAGNOSIS — R202 Paresthesia of skin: Secondary | ICD-10-CM | POA: Diagnosis not present

## 2014-01-02 DIAGNOSIS — Z471 Aftercare following joint replacement surgery: Secondary | ICD-10-CM | POA: Diagnosis not present

## 2014-01-02 DIAGNOSIS — Z96641 Presence of right artificial hip joint: Secondary | ICD-10-CM | POA: Diagnosis not present

## 2014-01-02 DIAGNOSIS — Z853 Personal history of malignant neoplasm of breast: Secondary | ICD-10-CM | POA: Diagnosis not present

## 2014-01-04 DIAGNOSIS — Z853 Personal history of malignant neoplasm of breast: Secondary | ICD-10-CM | POA: Diagnosis not present

## 2014-01-04 DIAGNOSIS — M810 Age-related osteoporosis without current pathological fracture: Secondary | ICD-10-CM | POA: Diagnosis not present

## 2014-01-04 DIAGNOSIS — Z96641 Presence of right artificial hip joint: Secondary | ICD-10-CM | POA: Diagnosis not present

## 2014-01-04 DIAGNOSIS — R202 Paresthesia of skin: Secondary | ICD-10-CM | POA: Diagnosis not present

## 2014-01-04 DIAGNOSIS — Z471 Aftercare following joint replacement surgery: Secondary | ICD-10-CM | POA: Diagnosis not present

## 2014-01-06 DIAGNOSIS — M810 Age-related osteoporosis without current pathological fracture: Secondary | ICD-10-CM | POA: Diagnosis not present

## 2014-01-06 DIAGNOSIS — R202 Paresthesia of skin: Secondary | ICD-10-CM | POA: Diagnosis not present

## 2014-01-06 DIAGNOSIS — Z96641 Presence of right artificial hip joint: Secondary | ICD-10-CM | POA: Diagnosis not present

## 2014-01-06 DIAGNOSIS — Z471 Aftercare following joint replacement surgery: Secondary | ICD-10-CM | POA: Diagnosis not present

## 2014-01-06 DIAGNOSIS — Z853 Personal history of malignant neoplasm of breast: Secondary | ICD-10-CM | POA: Diagnosis not present

## 2014-01-19 DIAGNOSIS — Z23 Encounter for immunization: Secondary | ICD-10-CM | POA: Diagnosis not present

## 2014-01-19 DIAGNOSIS — R938 Abnormal findings on diagnostic imaging of other specified body structures: Secondary | ICD-10-CM | POA: Diagnosis not present

## 2014-03-03 DIAGNOSIS — Z96649 Presence of unspecified artificial hip joint: Secondary | ICD-10-CM | POA: Diagnosis not present

## 2014-03-03 DIAGNOSIS — M810 Age-related osteoporosis without current pathological fracture: Secondary | ICD-10-CM | POA: Diagnosis not present

## 2014-03-03 DIAGNOSIS — Z170 Estrogen receptor positive status [ER+]: Secondary | ICD-10-CM | POA: Diagnosis not present

## 2014-03-03 DIAGNOSIS — Z853 Personal history of malignant neoplasm of breast: Secondary | ICD-10-CM | POA: Diagnosis not present

## 2014-03-28 DIAGNOSIS — L821 Other seborrheic keratosis: Secondary | ICD-10-CM | POA: Diagnosis not present

## 2014-06-08 DIAGNOSIS — Z471 Aftercare following joint replacement surgery: Secondary | ICD-10-CM | POA: Diagnosis not present

## 2014-06-08 DIAGNOSIS — Z96641 Presence of right artificial hip joint: Secondary | ICD-10-CM | POA: Diagnosis not present

## 2014-06-20 ENCOUNTER — Other Ambulatory Visit: Payer: Self-pay | Admitting: Orthopedic Surgery

## 2014-06-20 DIAGNOSIS — Z1389 Encounter for screening for other disorder: Secondary | ICD-10-CM | POA: Diagnosis not present

## 2014-06-20 DIAGNOSIS — Z9181 History of falling: Secondary | ICD-10-CM | POA: Diagnosis not present

## 2014-06-20 DIAGNOSIS — J329 Chronic sinusitis, unspecified: Secondary | ICD-10-CM | POA: Diagnosis not present

## 2014-07-03 DIAGNOSIS — Z853 Personal history of malignant neoplasm of breast: Secondary | ICD-10-CM | POA: Diagnosis not present

## 2014-07-03 DIAGNOSIS — C50112 Malignant neoplasm of central portion of left female breast: Secondary | ICD-10-CM | POA: Diagnosis not present

## 2014-07-03 DIAGNOSIS — Z170 Estrogen receptor positive status [ER+]: Secondary | ICD-10-CM | POA: Diagnosis not present

## 2014-07-05 NOTE — Pre-Procedure Instructions (Signed)
Erin Lutz  07/05/2014   Your procedure is scheduled on:  Monday July 17, 2014 at 9:46 AM.  Report to [facility] Admitting at 7:45 AM.  Call this number if you have problems the morning of surgery: [phone]   Remember:   Do not eat food or drink liquids after midnight.   Take these medicines the morning of surgery with A SIP OF WATER: NONE   Please stop taking any Ibuprofen, Advil, Motrin, Alleve, herbal medications on Monday April 11th   Do not wear jewelry, make-up or nail polish.  Do not wear lotions, powders, or perfumes. You may NOT wear deodorant.  Do not shave 48 hours prior to surgery.   Do not bring valuables to the hospital.  [facility] is not responsible for any belongings or valuables.               Contacts, dentures or bridgework may not be worn into surgery.  Leave suitcase in the car. After surgery it may be brought to your room.  For patients admitted to the hospital, discharge time is determined by your treatment team.               Patients discharged the day of surgery will not be allowed to drive home.  Name and phone number of your driver:   Special Instructions: Shower using CHG soap the night before and the morning of your surgery   Please read over the following fact sheets that you were given: Pain Booklet, Coughing and Deep Breathing, Blood Transfusion Information, Total Joint Packet, MRSA Information and Surgical Site Infection Prevention

## 2014-07-06 ENCOUNTER — Encounter (HOSPITAL_COMMUNITY): Payer: Self-pay

## 2014-07-06 ENCOUNTER — Encounter (HOSPITAL_COMMUNITY)
Admission: RE | Admit: 2014-07-06 | Discharge: 2014-07-06 | Disposition: A | Discharge status: Home / Self Care | Payer: Medicare Other | Source: Ambulatory Visit | Attending: Orthopedic Surgery | Admitting: Orthopedic Surgery

## 2014-07-06 DIAGNOSIS — E785 Hyperlipidemia, unspecified: Secondary | ICD-10-CM | POA: Insufficient documentation

## 2014-07-06 DIAGNOSIS — Z01812 Encounter for preprocedural laboratory examination: Secondary | ICD-10-CM | POA: Insufficient documentation

## 2014-07-06 HISTORY — DX: Gastro-esophageal reflux disease without esophagitis: K21.9

## 2014-07-06 HISTORY — DX: Unspecified malignant neoplasm of skin of nose: C44.301

## 2014-07-06 HISTORY — DX: Other seasonal allergic rhinitis: J30.2

## 2014-07-06 LAB — URINALYSIS, ROUTINE W REFLEX MICROSCOPIC
Bilirubin Urine: NEGATIVE
Glucose, UA: NEGATIVE mg/dL
HGB URINE DIPSTICK: NEGATIVE
Ketones, ur: NEGATIVE mg/dL
Nitrite: NEGATIVE
PROTEIN: NEGATIVE mg/dL
Specific Gravity, Urine: 1.02 (ref 1.005–1.030)
UROBILINOGEN UA: 1 mg/dL (ref 0.0–1.0)
pH: 6 (ref 5.0–8.0)

## 2014-07-06 LAB — BASIC METABOLIC PANEL
ANION GAP: 12 (ref 5–15)
BUN: 21 mg/dL (ref 6–23)
CO2: 22 mmol/L (ref 19–32)
Calcium: 9.1 mg/dL (ref 8.4–10.5)
Chloride: 104 mmol/L (ref 96–112)
Creatinine, Ser: 1.21 mg/dL — ABNORMAL HIGH (ref 0.50–1.10)
GFR calc Af Amer: 46 mL/min — ABNORMAL LOW (ref >90)
GFR calc non Af Amer: 39 mL/min — ABNORMAL LOW (ref >90)
GLUCOSE: 93 mg/dL (ref 70–99)
POTASSIUM: 4.1 mmol/L (ref 3.5–5.1)
Sodium: 138 mmol/L (ref 135–145)

## 2014-07-06 LAB — CBC WITH DIFFERENTIAL/PLATELET
BASOS ABS: 0 10*3/uL (ref 0.0–0.1)
Basophils Relative: 0 % (ref 0–1)
EOS PCT: 2 % (ref 0–5)
Eosinophils Absolute: 0.2 10*3/uL (ref 0.0–0.7)
HCT: 40.7 % (ref 36.0–46.0)
Hemoglobin: 13 g/dL (ref 12.0–15.0)
LYMPHS PCT: 32 % (ref 12–46)
Lymphs Abs: 2.4 10*3/uL (ref 0.7–4.0)
MCH: 29.9 pg (ref 26.0–34.0)
MCHC: 31.9 g/dL (ref 30.0–36.0)
MCV: 93.6 fL (ref 78.0–100.0)
Monocytes Absolute: 0.5 10*3/uL (ref 0.1–1.0)
Monocytes Relative: 6 % (ref 3–12)
Neutro Abs: 4.3 10*3/uL (ref 1.7–7.7)
Neutrophils Relative %: 60 % (ref 43–77)
PLATELETS: 240 10*3/uL (ref 150–400)
RBC: 4.35 MIL/uL (ref 3.87–5.11)
RDW: 13.9 % (ref 11.5–15.5)
WBC: 7.3 10*3/uL (ref 4.0–10.5)

## 2014-07-06 LAB — SURGICAL PCR SCREEN
MRSA, PCR: NEGATIVE
STAPHYLOCOCCUS AUREUS: NEGATIVE

## 2014-07-06 LAB — URINE MICROSCOPIC-ADD ON

## 2014-07-06 LAB — PROTIME-INR
INR: 1.03 (ref 0.00–1.49)
Prothrombin Time: 13.7 seconds (ref 11.6–15.2)

## 2014-07-06 LAB — TYPE AND SCREEN
ABO/RH(D): O POS
Antibody Screen: NEGATIVE

## 2014-07-06 LAB — APTT: aPTT: 31 seconds (ref 24–37)

## 2014-07-06 NOTE — Progress Notes (Signed)
PCP is [facility] in [place], [place]. Patient denied having any chest pain, shortness of breath, or discomfort, but did inform Nurse that she had some sinus drainage that her PCP is aware of. Patient denied having any fever, but stated she thought it was seasonal allergy related.   Nurse noticed patients prior xray from 12/12/13 and asked patient if she had any xrays thereafter. Patient informed Nurse that she was told that they saw a spot on her lung with the 12/12/13 xray and that she had a follow up xray afterwards, but she was told that "they did not know what it was and that they would just keep a eye on that spot."

## 2014-07-07 DIAGNOSIS — Z0000 Encounter for general adult medical examination without abnormal findings: Secondary | ICD-10-CM | POA: Diagnosis not present

## 2014-07-07 DIAGNOSIS — Z79899 Other long term (current) drug therapy: Secondary | ICD-10-CM | POA: Diagnosis not present

## 2014-07-07 DIAGNOSIS — E780 Pure hypercholesterolemia: Secondary | ICD-10-CM | POA: Diagnosis not present

## 2014-07-11 NOTE — H&P (Signed)
TOTAL HIP ADMISSION H&P  Patient is admitted for left total hip arthroplasty.  Subjective:  Chief Complaint: left hip pain  HPI: Erin Lutz, 81 y.o. female, has a history of pain and functional disability in the left hip(s) due to arthritis and patient has failed non-surgical conservative treatments for greater than 12 weeks to include NSAID's and/or analgesics, flexibility and strengthening excercises, use of assistive devices, weight reduction as appropriate and activity modification.  Onset of symptoms was gradual starting several years ago with gradually worsening course since that time.The patient noted no past surgery on the left hip(s).  Patient currently rates pain in the left hip at 10 out of 10 with activity. Patient has night pain, worsening of pain with activity and weight bearing, trendelenberg gait, pain that interfers with activities of daily living, pain with passive range of motion and crepitus. Patient has evidence of subchondral sclerosis and joint space narrowing by imaging studies. This condition presents safety issues increasing the risk of falls.   There is no current active infection.  Patient Active Problem List   Diagnosis Date Noted  . Arthritis of right hip 12/12/2013  . Arthritis, hip 12/12/2013   Past Medical History  Diagnosis Date  . Hyperlipidemia     takes Vytorin every other day  . Cancer     breast  . Pneumonia 66 yrs ago  . History of bronchitis 38yrs ago  . History of migraine 86yrs ago  . Numbness     bilateral feet  . Arthritis   . Joint pain   . Joint swelling   . Osteoporosis   . History of colon polyps   . Urinary frequency   . Nocturia   . Seasonal allergies   . GERD (gastroesophageal reflux disease)   . Skin cancer of nose     Past Surgical History  Procedure Laterality Date  . Cleft palate surgery  as a child  . Appendectomy    . Polyp removed from uterus    . Cataract surgery Bilateral   . Colonoscopy    .  Esophagogastroduodenoscopy    . Total hip arthroplasty Right 12/12/2013    Procedure: RIGHT TOTAL HIP ARTHROPLASTY;  Surgeon: Kerin Salen, MD;  Location: [facility];  Service: Orthopedics;  Laterality: Right;  . Mastectomy Left     No prescriptions prior to admission   No Known Allergies  History  Substance Use Topics  . Smoking status: Never Smoker   . Smokeless tobacco: Not on file  . Alcohol Use: No    No family history on file.   Review of Systems  Constitutional: Negative.   HENT: Negative.   Eyes: Negative.   Respiratory: Negative.   Cardiovascular: Negative.   Gastrointestinal: Negative.   Genitourinary: Negative.   Musculoskeletal: Positive for joint pain.  Skin: Negative.   Neurological: Negative.        Poor balance  Endo/Heme/Allergies: Bruises/bleeds easily.  Psychiatric/Behavioral: Positive for depression. The patient is nervous/anxious.     Objective:  Physical Exam  Constitutional: She is oriented to person, place, and time. She appears well-developed and well-nourished.  HENT:  Head: Normocephalic and atraumatic.  Eyes: Pupils are equal, round, and reactive to light.  Neck: Normal range of motion. Neck supple.  Cardiovascular: Intact distal pulses.   Respiratory: Effort normal.  Musculoskeletal: She exhibits tenderness.  The left hip.  Skin is intact.  Internal rotation to 0 causes severe pain external rotation to 30 also causes severe pain foot tap is  negative full range of motion of the knee.  Normal sensation of the foot.  Toes are pink and well perfused.  Neurological: She is alert and oriented to person, place, and time.  Skin: Skin is warm and dry.  Psychiatric: She has a normal mood and affect. Her behavior is normal. Judgment and thought content normal.    Vital signs in last 24 hours:    Labs:   Estimated body mass index is 26.06 kg/(m^2) as calculated from the following:   Height as of 12/15/13: 5\' 2"  (1.575 m).   Weight as of 12/02/13:  64.638 kg (142 lb 8 oz).   Imaging Review Plain radiographs demonstrate AP pelvis and crosstable lateral shows well-placed well fixed hybrid right total hip, and end-stage arthritis of the left hip bone-on-bone.  Assessment/Plan:  End stage arthritis, left hip(s)  The patient history, physical examination, clinical judgement of the provider and imaging studies are consistent with end stage degenerative joint disease of the left hip(s) and total hip arthroplasty is deemed medically necessary. The treatment options including medical management, injection therapy, arthroscopy and arthroplasty were discussed at length. The risks and benefits of total hip arthroplasty were presented and reviewed. The risks due to aseptic loosening, infection, stiffness, dislocation/subluxation,  thromboembolic complications and other imponderables were discussed.  The patient acknowledged the explanation, agreed to proceed with the plan and consent was signed. Patient is being admitted for inpatient treatment for surgery, pain control, PT, OT, prophylactic antibiotics, VTE prophylaxis, progressive ambulation and ADL's and discharge planning.The patient is planning to be discharged to skilled nursing facility

## 2014-07-14 NOTE — Progress Notes (Signed)
Patient instructed to arrive at 700 am 07-17-14.

## 2014-07-16 DIAGNOSIS — M1612 Unilateral primary osteoarthritis, left hip: Secondary | ICD-10-CM | POA: Diagnosis present

## 2014-07-16 MED ORDER — DEXTROSE-NACL 5-0.45 % IV SOLN: INTRAVENOUS | Status: DC

## 2014-07-16 MED ORDER — CHLORHEXIDINE GLUCONATE 4 % EX LIQD
60.0000 mL | Freq: Once | CUTANEOUS | Status: DC
  Filled 2014-07-16: qty 60

## 2014-07-16 MED ORDER — CEFAZOLIN SODIUM-DEXTROSE 2-3 GM-% IV SOLR
2.0000 g | INTRAVENOUS | Status: AC
  Administered 2014-07-17: 2 g via INTRAVENOUS
  Filled 2014-07-16: qty 50

## 2014-07-17 ENCOUNTER — Encounter (HOSPITAL_COMMUNITY)
Admission: RE | Disposition: A | Discharge status: Home Health | Payer: Self-pay | Source: Ambulatory Visit | Attending: Orthopedic Surgery

## 2014-07-17 ENCOUNTER — Inpatient Hospital Stay (HOSPITAL_COMMUNITY): Payer: Medicare Other

## 2014-07-17 ENCOUNTER — Inpatient Hospital Stay (HOSPITAL_COMMUNITY): Payer: Medicare Other | Admitting: Certified Registered Nurse Anesthetist

## 2014-07-17 ENCOUNTER — Encounter (HOSPITAL_COMMUNITY): Payer: Self-pay | Admitting: *Deleted

## 2014-07-17 ENCOUNTER — Inpatient Hospital Stay (HOSPITAL_COMMUNITY)
Admission: RE | Admit: 2014-07-17 | Discharge: 2014-07-19 | DRG: 470 | Disposition: A | Discharge status: Home Health | Payer: Medicare Other | Source: Ambulatory Visit | Attending: Orthopedic Surgery | Admitting: Orthopedic Surgery

## 2014-07-17 ENCOUNTER — Inpatient Hospital Stay (HOSPITAL_COMMUNITY): Payer: Medicare Other | Admitting: Vascular Surgery

## 2014-07-17 DIAGNOSIS — M810 Age-related osteoporosis without current pathological fracture: Secondary | ICD-10-CM | POA: Diagnosis present

## 2014-07-17 DIAGNOSIS — K219 Gastro-esophageal reflux disease without esophagitis: Secondary | ICD-10-CM | POA: Diagnosis present

## 2014-07-17 DIAGNOSIS — Z9012 Acquired absence of left breast and nipple: Secondary | ICD-10-CM | POA: Diagnosis present

## 2014-07-17 DIAGNOSIS — D62 Acute posthemorrhagic anemia: Secondary | ICD-10-CM | POA: Diagnosis not present

## 2014-07-17 DIAGNOSIS — Z7982 Long term (current) use of aspirin: Secondary | ICD-10-CM | POA: Diagnosis not present

## 2014-07-17 DIAGNOSIS — E785 Hyperlipidemia, unspecified: Secondary | ICD-10-CM | POA: Diagnosis present

## 2014-07-17 DIAGNOSIS — Z853 Personal history of malignant neoplasm of breast: Secondary | ICD-10-CM | POA: Diagnosis not present

## 2014-07-17 DIAGNOSIS — Z79899 Other long term (current) drug therapy: Secondary | ICD-10-CM | POA: Diagnosis not present

## 2014-07-17 DIAGNOSIS — Z96642 Presence of left artificial hip joint: Secondary | ICD-10-CM | POA: Diagnosis not present

## 2014-07-17 DIAGNOSIS — M1610 Unilateral primary osteoarthritis, unspecified hip: Secondary | ICD-10-CM | POA: Diagnosis present

## 2014-07-17 DIAGNOSIS — M1612 Unilateral primary osteoarthritis, left hip: Secondary | ICD-10-CM | POA: Diagnosis present

## 2014-07-17 DIAGNOSIS — R112 Nausea with vomiting, unspecified: Secondary | ICD-10-CM | POA: Diagnosis not present

## 2014-07-17 DIAGNOSIS — M169 Osteoarthritis of hip, unspecified: Secondary | ICD-10-CM | POA: Diagnosis not present

## 2014-07-17 DIAGNOSIS — Z96649 Presence of unspecified artificial hip joint: Secondary | ICD-10-CM

## 2014-07-17 DIAGNOSIS — Z471 Aftercare following joint replacement surgery: Secondary | ICD-10-CM | POA: Diagnosis not present

## 2014-07-17 DIAGNOSIS — M25552 Pain in left hip: Secondary | ICD-10-CM | POA: Diagnosis not present

## 2014-07-17 HISTORY — PX: TOTAL HIP ARTHROPLASTY: SHX124

## 2014-07-17 SURGERY — ARTHROPLASTY, HIP, TOTAL,POSTERIOR APPROACH
Anesthesia: Monitor Anesthesia Care | Laterality: Left

## 2014-07-17 MED ORDER — OXYCODONE HCL 5 MG PO TABS
ORAL_TABLET | ORAL | Status: AC
  Filled 2014-07-17: qty 2

## 2014-07-17 MED ORDER — 0.9 % SODIUM CHLORIDE (POUR BTL) OPTIME
TOPICAL | Status: DC | PRN
  Administered 2014-07-17: 1000 mL

## 2014-07-17 MED ORDER — DIPHENHYDRAMINE HCL 12.5 MG/5ML PO ELIX: 12.5000 mg | ORAL_SOLUTION | ORAL | Status: DC | PRN

## 2014-07-17 MED ORDER — METOCLOPRAMIDE HCL 5 MG PO TABS: 5.0000 mg | ORAL_TABLET | Freq: Three times a day (TID) | ORAL | Status: DC | PRN

## 2014-07-17 MED ORDER — ACETAMINOPHEN 325 MG PO TABS
ORAL_TABLET | ORAL | Status: AC
  Filled 2014-07-17: qty 2

## 2014-07-17 MED ORDER — SENNOSIDES-DOCUSATE SODIUM 8.6-50 MG PO TABS: 1.0000 | ORAL_TABLET | Freq: Every evening | ORAL | Status: DC | PRN

## 2014-07-17 MED ORDER — HYDROMORPHONE HCL 1 MG/ML IJ SOLN
0.2500 mg | INTRAMUSCULAR | Status: DC | PRN
  Administered 2014-07-17 (×4): 0.5 mg via INTRAVENOUS

## 2014-07-17 MED ORDER — EZETIMIBE-SIMVASTATIN 10-20 MG PO TABS
1.0000 | ORAL_TABLET | ORAL | Status: DC
  Administered 2014-07-19: 1 via ORAL
  Filled 2014-07-17 (×3): qty 1

## 2014-07-17 MED ORDER — BUPIVACAINE-EPINEPHRINE 0.5% -1:200000 IJ SOLN
INTRAMUSCULAR | Status: DC | PRN
  Administered 2014-07-17: 10 mL

## 2014-07-17 MED ORDER — OXYCODONE HCL 5 MG PO TABS: 5.0000 mg | ORAL_TABLET | Freq: Once | ORAL | Status: DC | PRN

## 2014-07-17 MED ORDER — CEFUROXIME SODIUM 1.5 G IJ SOLR
INTRAMUSCULAR | Status: AC
  Filled 2014-07-17: qty 1.5

## 2014-07-17 MED ORDER — ACETAMINOPHEN 650 MG RE SUPP
650.0000 mg | Freq: Four times a day (QID) | RECTAL | Status: DC | PRN
  Filled 2014-07-17: qty 1

## 2014-07-17 MED ORDER — METOCLOPRAMIDE HCL 5 MG/ML IJ SOLN: 5.0000 mg | Freq: Three times a day (TID) | INTRAMUSCULAR | Status: DC | PRN

## 2014-07-17 MED ORDER — IBUPROFEN 200 MG PO TABS: 200.0000 mg | ORAL_TABLET | Freq: Four times a day (QID) | ORAL | Status: DC | PRN

## 2014-07-17 MED ORDER — CEFUROXIME SODIUM 1.5 G IJ SOLR
INTRAMUSCULAR | Status: DC | PRN
  Administered 2014-07-17: 1.5 g

## 2014-07-17 MED ORDER — ALUMINUM HYDROXIDE GEL 320 MG/5ML PO SUSP
15.0000 mL | ORAL | Status: DC | PRN
  Filled 2014-07-17: qty 30

## 2014-07-17 MED ORDER — OXYCODONE HCL 5 MG PO TABS
5.0000 mg | ORAL_TABLET | ORAL | Status: DC | PRN
  Administered 2014-07-17 – 2014-07-19 (×7): 10 mg via ORAL
  Filled 2014-07-17 (×7): qty 2

## 2014-07-17 MED ORDER — HYDROMORPHONE HCL 1 MG/ML IJ SOLN
0.5000 mg | INTRAMUSCULAR | Status: DC | PRN
  Administered 2014-07-17: 0.5 mg via INTRAVENOUS
  Filled 2014-07-17: qty 1

## 2014-07-17 MED ORDER — BUPIVACAINE IN DEXTROSE 0.75-8.25 % IT SOLN
INTRATHECAL | Status: DC | PRN
  Administered 2014-07-17: 12 mg via INTRATHECAL

## 2014-07-17 MED ORDER — LACTATED RINGERS IV SOLN
INTRAVENOUS | Status: DC
Start: 2014-07-17 — End: 2014-07-17
  Administered 2014-07-17: 50 mL/h via INTRAVENOUS

## 2014-07-17 MED ORDER — KCL IN DEXTROSE-NACL 20-5-0.45 MEQ/L-%-% IV SOLN
INTRAVENOUS | Status: DC
  Administered 2014-07-17: 17:00:00 via INTRAVENOUS
  Administered 2014-07-17 – 2014-07-18 (×2): 125 mL/h via INTRAVENOUS
  Administered 2014-07-18: 15:00:00 via INTRAVENOUS
  Filled 2014-07-17 (×10): qty 1000

## 2014-07-17 MED ORDER — METHOCARBAMOL 1000 MG/10ML IJ SOLN
500.0000 mg | Freq: Four times a day (QID) | INTRAMUSCULAR | Status: DC | PRN
  Filled 2014-07-17: qty 5

## 2014-07-17 MED ORDER — ACETAMINOPHEN 325 MG PO TABS
650.0000 mg | ORAL_TABLET | Freq: Four times a day (QID) | ORAL | Status: DC | PRN
  Administered 2014-07-17: 650 mg via ORAL
  Filled 2014-07-17: qty 2

## 2014-07-17 MED ORDER — METHOCARBAMOL 500 MG PO TABS
500.0000 mg | ORAL_TABLET | Freq: Four times a day (QID) | ORAL | Status: DC | PRN
  Administered 2014-07-17 – 2014-07-19 (×6): 500 mg via ORAL
  Filled 2014-07-17 (×8): qty 1

## 2014-07-17 MED ORDER — BUPIVACAINE-EPINEPHRINE (PF) 0.5% -1:200000 IJ SOLN
INTRAMUSCULAR | Status: AC
  Filled 2014-07-17: qty 30

## 2014-07-17 MED ORDER — ONDANSETRON HCL 4 MG/2ML IJ SOLN
4.0000 mg | Freq: Four times a day (QID) | INTRAMUSCULAR | Status: DC | PRN
  Administered 2014-07-17 – 2014-07-18 (×2): 4 mg via INTRAVENOUS
  Filled 2014-07-17 (×2): qty 2

## 2014-07-17 MED ORDER — PROPOFOL INFUSION 10 MG/ML OPTIME
INTRAVENOUS | Status: DC | PRN
  Administered 2014-07-17: 15 ug/kg/min via INTRAVENOUS

## 2014-07-17 MED ORDER — PROMETHAZINE HCL 25 MG/ML IJ SOLN: 6.2500 mg | INTRAMUSCULAR | Status: DC | PRN

## 2014-07-17 MED ORDER — LACTATED RINGERS IV SOLN
INTRAVENOUS | Status: DC
  Administered 2014-07-17: 08:00:00 via INTRAVENOUS

## 2014-07-17 MED ORDER — FLEET ENEMA 7-19 GM/118ML RE ENEM: 1.0000 | ENEMA | Freq: Once | RECTAL | Status: AC | PRN

## 2014-07-17 MED ORDER — METHOCARBAMOL 1000 MG/10ML IJ SOLN
500.0000 mg | INTRAVENOUS | Status: AC
  Administered 2014-07-17: 500 mg via INTRAVENOUS
  Filled 2014-07-17: qty 5

## 2014-07-17 MED ORDER — LIDOCAINE HCL 1 % IJ SOLN
INTRAMUSCULAR | Status: DC | PRN
  Administered 2014-07-17: 20 mg via INTRADERMAL

## 2014-07-17 MED ORDER — BISACODYL 5 MG PO TBEC: 5.0000 mg | DELAYED_RELEASE_TABLET | Freq: Every day | ORAL | Status: DC | PRN

## 2014-07-17 MED ORDER — FENTANYL CITRATE (PF) 250 MCG/5ML IJ SOLN
INTRAMUSCULAR | Status: AC
  Filled 2014-07-17: qty 5

## 2014-07-17 MED ORDER — FENTANYL CITRATE (PF) 100 MCG/2ML IJ SOLN
INTRAMUSCULAR | Status: DC | PRN
  Administered 2014-07-17 (×2): 50 ug via INTRAVENOUS

## 2014-07-17 MED ORDER — SODIUM CHLORIDE 0.9 % IR SOLN
Status: DC | PRN
  Administered 2014-07-17: 1000 mL

## 2014-07-17 MED ORDER — HYDROMORPHONE HCL 1 MG/ML IJ SOLN
INTRAMUSCULAR | Status: AC
  Administered 2014-07-17: 0.5 mg via INTRAVENOUS
  Filled 2014-07-17: qty 1

## 2014-07-17 MED ORDER — TRANEXAMIC ACID 1000 MG/10ML IV SOLN
1000.0000 mg | INTRAVENOUS | Status: AC
  Administered 2014-07-17: 1000 mg via INTRAVENOUS
  Filled 2014-07-17: qty 10

## 2014-07-17 MED ORDER — DEXAMETHASONE SODIUM PHOSPHATE 10 MG/ML IJ SOLN
10.0000 mg | Freq: Once | INTRAMUSCULAR | Status: DC
  Filled 2014-07-17: qty 1

## 2014-07-17 MED ORDER — ONDANSETRON HCL 4 MG PO TABS: 4.0000 mg | ORAL_TABLET | Freq: Four times a day (QID) | ORAL | Status: DC | PRN

## 2014-07-17 MED ORDER — ONDANSETRON HCL 4 MG/2ML IJ SOLN
INTRAMUSCULAR | Status: DC | PRN
  Administered 2014-07-17: 4 mg via INTRAVENOUS

## 2014-07-17 MED ORDER — ASPIRIN EC 325 MG PO TBEC
325.0000 mg | DELAYED_RELEASE_TABLET | Freq: Every day | ORAL | Status: DC
  Administered 2014-07-18 – 2014-07-19 (×2): 325 mg via ORAL
  Filled 2014-07-17 (×2): qty 1

## 2014-07-17 MED ORDER — MENTHOL 3 MG MT LOZG: 1.0000 | LOZENGE | OROMUCOSAL | Status: DC | PRN

## 2014-07-17 MED ORDER — PHENOL 1.4 % MT LIQD: 1.0000 | OROMUCOSAL | Status: DC | PRN

## 2014-07-17 MED ORDER — OXYCODONE HCL 5 MG/5ML PO SOLN: 5.0000 mg | Freq: Once | ORAL | Status: DC | PRN

## 2014-07-17 MED ORDER — DOCUSATE SODIUM 100 MG PO CAPS
100.0000 mg | ORAL_CAPSULE | Freq: Two times a day (BID) | ORAL | Status: DC
  Administered 2014-07-17 – 2014-07-19 (×4): 100 mg via ORAL
  Filled 2014-07-17 (×5): qty 1

## 2014-07-17 SURGICAL SUPPLY — 60 items
BLADE SAW SGTL 18X1.27X75 (BLADE) ×2 IMPLANT
BLADE SAW SGTL 18X1.27X75MM (BLADE) ×1
BRUSH FEMORAL CANAL (MISCELLANEOUS) ×3 IMPLANT
CAPT HIP TOTAL 2 ×3 IMPLANT
CEMENT HV SMART SET (Cement) ×6 IMPLANT
CEMENT RESTRICTOR DEPUY SZ 3 (Cement) ×3 IMPLANT
COVER BACK TABLE 24X17X13 BIG (DRAPES) IMPLANT
COVER SURGICAL LIGHT HANDLE (MISCELLANEOUS) ×6 IMPLANT
DRAPE IMP U-DRAPE 54X76 (DRAPES) ×3 IMPLANT
DRAPE ORTHO SPLIT 77X108 STRL (DRAPES) ×2
DRAPE PROXIMA HALF (DRAPES) ×3 IMPLANT
DRAPE SURG ORHT 6 SPLT 77X108 (DRAPES) ×1 IMPLANT
DRAPE U-SHAPE 47X51 STRL (DRAPES) ×3 IMPLANT
DRILL BIT 7/64X5 (BIT) ×3 IMPLANT
DRSG AQUACEL AG ADV 3.5X10 (GAUZE/BANDAGES/DRESSINGS) ×3 IMPLANT
DURAPREP 26ML APPLICATOR (WOUND CARE) ×3 IMPLANT
ELECT BLADE 4.0 EZ CLEAN MEGAD (MISCELLANEOUS) ×3
ELECT REM PT RETURN 9FT ADLT (ELECTROSURGICAL) ×3
ELECTRODE BLDE 4.0 EZ CLN MEGD (MISCELLANEOUS) ×1 IMPLANT
ELECTRODE REM PT RTRN 9FT ADLT (ELECTROSURGICAL) ×1 IMPLANT
GLOVE BIO SURGEON STRL SZ7 (GLOVE) ×3 IMPLANT
GLOVE BIO SURGEON STRL SZ7.5 (GLOVE) ×3 IMPLANT
GLOVE BIO SURGEON STRL SZ8.5 (GLOVE) ×6 IMPLANT
GLOVE BIOGEL PI IND STRL 6.5 (GLOVE) ×2 IMPLANT
GLOVE BIOGEL PI IND STRL 8 (GLOVE) ×2 IMPLANT
GLOVE BIOGEL PI IND STRL 9 (GLOVE) ×1 IMPLANT
GLOVE BIOGEL PI INDICATOR 6.5 (GLOVE) ×4
GLOVE BIOGEL PI INDICATOR 8 (GLOVE) ×4
GLOVE BIOGEL PI INDICATOR 9 (GLOVE) ×2
GLOVE SURG SS PI 6.0 STRL IVOR (GLOVE) ×3 IMPLANT
GOWN STRL REUS W/ TWL LRG LVL3 (GOWN DISPOSABLE) ×4 IMPLANT
GOWN STRL REUS W/ TWL XL LVL3 (GOWN DISPOSABLE) ×3 IMPLANT
GOWN STRL REUS W/TWL LRG LVL3 (GOWN DISPOSABLE) ×8
GOWN STRL REUS W/TWL XL LVL3 (GOWN DISPOSABLE) ×6
HANDPIECE INTERPULSE COAX TIP (DISPOSABLE) ×2
HOOD PEEL AWAY FACE SHEILD DIS (HOOD) ×6 IMPLANT
KIT BASIN OR (CUSTOM PROCEDURE TRAY) ×3 IMPLANT
KIT ROOM TURNOVER OR (KITS) ×3 IMPLANT
MANIFOLD NEPTUNE II (INSTRUMENTS) ×3 IMPLANT
NEEDLE 22X1 1/2 (OR ONLY) (NEEDLE) ×3 IMPLANT
NS IRRIG 1000ML POUR BTL (IV SOLUTION) ×3 IMPLANT
PACK TOTAL JOINT (CUSTOM PROCEDURE TRAY) ×3 IMPLANT
PACK UNIVERSAL I (CUSTOM PROCEDURE TRAY) ×3 IMPLANT
PAD ARMBOARD 7.5X6 YLW CONV (MISCELLANEOUS) ×6 IMPLANT
PASSER SUT SWANSON 36MM LOOP (INSTRUMENTS) ×3 IMPLANT
PRESSURIZER FEMORAL UNIV (MISCELLANEOUS) ×3 IMPLANT
SET HNDPC FAN SPRY TIP SCT (DISPOSABLE) ×1 IMPLANT
SUT ETHIBOND 2 V 37 (SUTURE) ×3 IMPLANT
SUT VIC AB 0 CTB1 27 (SUTURE) ×3 IMPLANT
SUT VIC AB 1 CTX 36 (SUTURE) ×2
SUT VIC AB 1 CTX36XBRD ANBCTR (SUTURE) ×1 IMPLANT
SUT VIC AB 2-0 CTB1 (SUTURE) ×3 IMPLANT
SUT VIC AB 3-0 SH 27 (SUTURE) ×2
SUT VIC AB 3-0 SH 27X BRD (SUTURE) ×1 IMPLANT
SYR CONTROL 10ML LL (SYRINGE) ×3 IMPLANT
TOWEL OR 17X24 6PK STRL BLUE (TOWEL DISPOSABLE) ×3 IMPLANT
TOWEL OR 17X26 10 PK STRL BLUE (TOWEL DISPOSABLE) ×3 IMPLANT
TOWER CARTRIDGE SMART MIX (DISPOSABLE) ×3 IMPLANT
TRAY FOLEY CATH 14FR (SET/KITS/TRAYS/PACK) IMPLANT
WATER STERILE IRR 1000ML POUR (IV SOLUTION) ×3 IMPLANT

## 2014-07-17 NOTE — Transfer of Care (Signed)
Immediate Anesthesia Transfer of Care Note  Patient: Erin Lutz  Procedure(s) Performed: Procedure(s): TOTAL HIP ARTHROPLASTY (Left)  Patient Location: PACU  Anesthesia Type:MAC and Spinal  Level of Consciousness: awake, alert  and oriented  Airway & Oxygen Therapy: Patient Spontanous Breathing and Patient connected to face mask oxygen  Post-op Assessment: Report given to RN and Post -op Vital signs reviewed and stable  Post vital signs: Reviewed and stable  Last Vitals:  Filed Vitals:   07/17/14 0716  BP: 179/83  Pulse: 79  Temp: 36.7 C  Resp: 20    Complications: No apparent anesthesia complications

## 2014-07-17 NOTE — Progress Notes (Signed)
PT Cancellation Note  Patient Details Name: Erin Lutz MRN: [id number] DOB: [birth year]   Cancelled Treatment:    Reason Eval/Treat Not Completed: Pain limiting ability to participate.  Pt refuses therapy at this time 2/2 pain.  PT will complete evaluation once pt is able to participate.  Thank you for this order.  Joslyn Hy PT, DPT [phone] Pager: [phone] 07/17/2014, 2:02 PM

## 2014-07-17 NOTE — Anesthesia Postprocedure Evaluation (Signed)
Anesthesia Post Note  Patient: Erin Lutz  Procedure(s) Performed: Procedure(s) (LRB): TOTAL HIP ARTHROPLASTY (Left)  Anesthesia type: MAC/SAB  Patient location: PACU  Post pain: Pain level controlled  Post assessment: Patient's Cardiovascular Status Stable  Last Vitals:  Filed Vitals:   07/17/14 1115  BP: 123/67  Pulse: 74  Temp:   Resp: 13    Post vital signs: Reviewed and stable  Level of consciousness: sedated  Complications: No apparent anesthesia complications

## 2014-07-17 NOTE — Progress Notes (Signed)
Report given to jamie hart rn as caregiver 

## 2014-07-17 NOTE — Progress Notes (Signed)
Utilization review completed.  

## 2014-07-17 NOTE — Anesthesia Preprocedure Evaluation (Signed)
Anesthesia Evaluation  Patient identified by MRN, date of birth, ID band Patient awake    Reviewed: Allergy & Precautions, NPO status , Patient's Chart, lab work & pertinent test results, Unable to perform ROS - Chart review only  History of Anesthesia Complications Negative for: history of anesthetic complications  Airway Mallampati: II  TM Distance: >3 FB Neck ROM: Full    Dental  (+) Teeth Intact   Pulmonary neg pulmonary ROS,    Pulmonary exam normal       Cardiovascular negative cardio ROS      Neuro/Psych negative neurological ROS  negative psych ROS   GI/Hepatic Neg liver ROS, GERD-  ,  Endo/Other  negative endocrine ROS  Renal/GU negative Renal ROS     Musculoskeletal   Abdominal   Peds  Hematology   Anesthesia Other Findings   Reproductive/Obstetrics                             Anesthesia Physical Anesthesia Plan  ASA: III  Anesthesia Plan: MAC and Spinal   Post-op Pain Management:    Induction: Intravenous  Airway Management Planned: Simple Face Mask  Additional Equipment:   Intra-op Plan:   Post-operative Plan:   Informed Consent: I have reviewed the patients History and Physical, chart, labs and discussed the procedure including the risks, benefits and alternatives for the proposed anesthesia with the patient or authorized representative who has indicated his/her understanding and acceptance.   Dental advisory given  Plan Discussed with: CRNA, Anesthesiologist and Surgeon  Anesthesia Plan Comments:         Anesthesia Quick Evaluation

## 2014-07-17 NOTE — Op Note (Signed)
PATIENT ID:      Erin Lutz  MRN:     [id number] DOB/AGE:    11/01/1925 / 81 y.o. y.o.       OPERATIVE REPORT    DATE OF PROCEDURE:  07/17/2014       PREOPERATIVE DIAGNOSIS:  OSTEOARTHRITIS LEFT HIP                                                       Estimated body mass index is 26.51 kg/(m^2) as calculated from the following:   Height as of 07/06/14: 5\' 2"  (1.575 m).   Weight as of this encounter: 65.772 kg (145 lb).     POSTOPERATIVE DIAGNOSIS:  OSTEOARTHRITIS LEFT HIP                                                           PROCEDURE:  L total hip arthroplasty using a 48 mm DePuy Pinnacle  Cup, [employer], 10-degree polyethylene liner index superior  and posterior, a +0 32 mm metal head, a #4 Summit  stem, cemented, double batch of DePuy HV cement with 1500 mg of Zinacef, 11 mm centralizer and #3 central canal occluder   SURGEON: Aniesha Haughn J    ASSISTANT:   Eric K. Barton Dubois  (present throughout entire procedure and necessary for timely completion of the procedure)  ANESTHESIA: Spinal  BLOOD LOSS: 300 FLUID REPLACEMENT: 1500 crystalloid Tranexamic Acid: 1 gm IV DRAINS: None COMPLICATIONS: None    INDICATIONS FOR PROCEDURE:Patient with end-stage arthritis of the L hip.  X-rays show bone-on-bone arthritic changes. Despite conservative measures with observation, anti-inflammatory medicine, narcotics, use of a cane, has severe unremitting pain and can ambulate only 2 blocks before resting.  Patient desires elective right total hip arthroplasty to decrease pain and increase function. The risks, benefits, and alternatives were discussed at length including but not limited to the risks of infection, bleeding, nerve injury, stiffness, blood clots, the need for revision surgery, cardiopulmonary complications, among others, and they were willing to proceed.Benefits have been discussed. Questions answered.     PROCEDURE IN DETAIL: The patient was identified by armband,   received preoperative IV antibiotics in the holding area at [facility], taken to the operating room , appropriate anesthetic monitors  were attached and general endotracheal anesthesia induced. Foley catheter was inserted. Patient was rolled into the R lateral decubitus position and fixed there with a Stulberg Mark II pelvic clamp and the L lower extremity was then prepped and draped  in the usual sterile fashion from the ankle to the hemipelvis. A time-out  procedure was performed. The skin along the lateral hip and thigh  infiltrated with 10 mL of 0.5% Marcaine and epinephrine solution. We  then made a posterolateral approach to the hip. With a #10 blade, 15 cm  incision through skin and subcutaneous tissue down to the level of the  IT band. Small bleeders were identified and cauterized. IT band cut in  line with skin incision exposing the greater trochanter. A Cobra retractor was placed between the gluteus minimus and the superior hip joint capsule, and a spiked Cobra between the  quadratus femoris and the inferior hip joint capsule. This isolated the short  external rotators and piriformis tendons. These were tagged with a #2 Ethibond  suture and cut off their insertion on the intertrochanteric crest. The posterior  capsule was then developed into an acetabular-based flap from Posterior Superior off of the acetabulum out over the femoral neck and back posterior inferior to the acetabular rim. This flap was tagged with two #2 Ethibond sutures and retracted protecting the sciatic nerve. This exposed the arthritic femoral head and osteophytes. The hip was then flexed and internally rotated, dislocating the femoral head and a standard neck cut performed 1 fingerbreadth above the lesser trochanter.  A spiked Cobra was placed in the cotyloid notch and a Hohmann retractor was then used to lever the femur anteriorly off of the anterior pelvic column. A posterior-inferior wing retractor was placed  at the junction of the acetabulum and the ischium completing the acetabular exposure.We then removed the peripheral osteophytes and labrum from the acetabulum. We then reamed the acetabulum up to 47 mm with basket reamers obtaining good coverage in all quadrants, irrigated out with normal  saline solution and hammered into place a 48 mm pinnacle cup in 45  degrees of abduction and about 20 degrees of anteversion. More  peripheral osteophytes removed and a trial 10-degree liner placed with the  index superior-posterior. The hip was then flexed and internally rotated exposing the  proximal femur, which was entered with the initiating reamer followed by  the tapered reamers up to a 4-5 tapered reamer and broaching up to a #4 broach, which had good fit and fill. A trial reduction was then  performed with a +0  32-mm ball on the standard neck and  excellent stability was noted with at 90 of flexion with 75 of  internal rotation and then full extension withexternal rotation. The hip  could not be dislocated in full extension. The knee could easily flex  to about 140 degrees. We also stretched the abductors at this point,  because of the preexisting adductor contractures. All trial components  were then removed. The acetabulum was irrigated out with normal saline  solution. A titanium [facility] was then screwed into place  followed by a 10-degree polyethylene liner index superior-posterior. On  the femoral side, we then sized for a #3 cement restrictor and irrigated  out the femoral canal with normal saline solution and dried with suction  and sponges. The cement restrictor was placed to the appropriate depth. A  double batch of DePuy 1 cement with 1500 mg of Zinacef was mixed and  injected into the canal under pressure followed by #4 Summit basic stem  in 20 degrees of anteversion and as this was held in place, excess cement  was removed. At this point, a +0 32-mm metal head was   hammered on the stem. The hip was reduced. We checked our stability  one more time and found to be excellent. The wound was once again  thoroughly irrigated out with normal saline solution pulse lavage. The  capsular flap and short external rotators were repaired back to the  intertrochanteric crest through drill holes with a #2 Ethibond suture.  The IT band was closed with running 1 Vicryl suture. The subcutaneous  tissue with 0 and 2-0 undyed Vicryl suture and the skin with running  3-0 Vivryl SQ suture. Aquacil dessing was applied. The patient was then unclamped, rolled supine, awaken extubated and taken to recovery room without difficulty  in stable condition.   Ciarah Peace J 07/17/2014, 10:00 AM

## 2014-07-17 NOTE — Interval H&P Note (Signed)
History and Physical Interval Note:  07/17/2014 7:17 AM  Erin Lutz  has presented today for surgery, with the diagnosis of OSTEOARTHRITIS LEFT HIP  The various methods of treatment have been discussed with the patient and family. After consideration of risks, benefits and other options for treatment, the patient has consented to  Procedure(s): TOTAL HIP ARTHROPLASTY (Left) as a surgical intervention .  The patient's history has been reviewed, patient examined, no change in status, stable for surgery.  I have reviewed the patient's chart and labs.  Questions were answered to the patient's satisfaction.     Kerin Salen

## 2014-07-17 NOTE — Progress Notes (Signed)
OT Cancellation Note  Patient Details Name: Erin Lutz MRN: [id number] DOB: 1925/09/07   Cancelled Treatment:    Reason Eval/Treat Not Completed: Other (comment) Pt is Medicare and current D/C plan is SNF. No apparent immediate acute care OT needs, therefore will defer OT to SNF. If OT eval is needed please call Acute Rehab Dept. at [phone] or text page OT at [phone].   [place], OTR/L  [phone] 07/17/2014 07/17/2014, 9:59 PM

## 2014-07-17 NOTE — Evaluation (Signed)
Physical Therapy Evaluation Patient Details Name: Erin Lutz MRN: [id number] DOB: 1925-12-05 Today's Date: 07/17/2014   History of Present Illness  Pt is a 81 y/o F s/p L THA, post precautions.  Pt's PMH includes breast cancer, numbness B feet, osteoporosis, urinary frequency, GERD.   Clinical Impression  Pt is s/p L THA resulting in the deficits listed below (see PT Problem List). Pt ambulatory distance limited 2/2 dizziness while standing today and pt's emotional status likely 2/2 drowsiness. Pt will benefit from skilled PT to increase their independence and safety with mobility to allow discharge to the venue listed below.  Recommending SNF at this time 2/2 pt's limited mobility and decreased safety this session, will continue to update d/c plan if appropriate each session.    Follow Up Recommendations SNF    Equipment Recommendations  None recommended by PT    Recommendations for Other Services       Precautions / Restrictions Precautions Precaution Booklet Issued: Yes (comment) Precaution Comments: reviewed post hip precautions Restrictions Weight Bearing Restrictions: Yes LLE Weight Bearing: Weight bearing as tolerated      Mobility  Bed Mobility Overal bed mobility: Needs Assistance Bed Mobility: Supine to Sit;Sit to Supine;Rolling Rolling: Modified independent (Device/Increase time)   Supine to sit: Min assist;HOB elevated Sit to supine: Min assist   General bed mobility comments: Min A for LLE during supine<>sit w/ use of bed rails. Rolling mod I w/ use of bed rails and verbal cues for positioning and sequencing throughout bed mobility   Transfers Overall transfer level: Needs assistance Equipment used: Rolling walker (2 wheeled) Transfers: Sit to/from Stand Sit to Stand: Min guard         General transfer comment: Multiple verbal cues for hand placement as pt wants to pull from RW during sit>stand.  Slight LOB once standing EOB but pt was able to  stabilize using RW.  Ambulation/Gait Ambulation/Gait assistance: Min guard Ambulation Distance (Feet): 2 Feet Assistive device: Rolling walker (2 wheeled) Gait Pattern/deviations: Step-to pattern;Decreased stride length;Decreased stance time - left;Shuffle;Antalgic;Trunk flexed Gait velocity: decreased Gait velocity interpretation: Below normal speed for age/gender General Gait Details: Distance limited as pt reports dizziness in standing which dissipates upon return to supine in bed.  Pt's trunk flexed.    Stairs            Wheelchair Mobility    Modified Rankin (Stroke Patients Only)       Balance Overall balance assessment: Needs assistance Sitting-balance support: Bilateral upper extremity supported;Feet supported Sitting balance-Leahy Scale: Fair     Standing balance support: Bilateral upper extremity supported;During functional activity Standing balance-Leahy Scale: Poor                               Pertinent Vitals/Pain Pain Assessment: 0-10 Pain Score: 3  Pain Location: L hip Pain Descriptors / Indicators: Grimacing;Guarding;Throbbing Pain Intervention(s): Limited activity within patient's tolerance;Monitored during session;Repositioned;Premedicated before session    [place] expects to be discharged to:: Private residence Living Arrangements: Spouse/significant other Available Help at Discharge: Family;Available 24 hours/day Type of Home: House Home Access: Stairs to enter Entrance Stairs-Rails: Right;Left (can't reach both at same time) Entrance Stairs-Number of Steps: 2 Home Layout: Two level;Able to live on main level with bedroom/bathroom Home Equipment: Kasandra Knudsen - single point;Bedside commode;Walker - 2 wheels      Prior Function Level of Independence: Independent with assistive device(s);Needs assistance (using cane at all time)  Gait / Transfers Assistance Needed: cane and HHA from husband for ambulation  ADL's /  Homemaking Assistance Needed: husband does laundry, occassionally needs assist donning/doffing socks and shoes        Hand Dominance   Dominant Hand: Right    Extremity/Trunk Assessment               Lower Extremity Assessment: LLE deficits/detail   LLE Deficits / Details: as expected s/p L THA  Cervical / Trunk Assessment: Kyphotic  Communication   Communication: No difficulties  Cognition Arousal/Alertness: Lethargic Behavior During Therapy: Agitated;Flat affect Overall Cognitive Status: Within Functional Limits for tasks assessed                      General Comments      Exercises Total Joint Exercises Ankle Circles/Pumps: AROM;Both;10 reps;Supine Quad Sets: AROM;Both;5 reps;Supine Heel Slides: AAROM;Left;5 reps;Supine      Assessment/Plan    PT Assessment Patient needs continued PT services  PT Diagnosis Difficulty walking;Abnormality of gait;Generalized weakness;Acute pain   PT Problem List Decreased strength;Decreased range of motion;Decreased activity tolerance;Decreased balance;Decreased mobility;Decreased coordination;Decreased knowledge of use of DME;Decreased safety awareness;Decreased knowledge of precautions;Pain  PT Treatment Interventions DME instruction;Gait training;Stair training;Functional mobility training;Therapeutic activities;Therapeutic exercise;Balance training;Neuromuscular re-education;Patient/family education;Modalities   PT Goals (Current goals can be found in the Care Plan section) Acute Rehab PT Goals Patient Stated Goal: to rest after treatment session PT Goal Formulation: With patient Time For Goal Achievement: 07/24/14 Potential to Achieve Goals: Good    Frequency 7X/week   Barriers to discharge Inaccessible home environment 2 steps to get inside home    Co-evaluation               End of Session Equipment Utilized During Treatment: Gait belt Activity Tolerance: Patient limited by fatigue;Patient limited  by pain (limited 2/2 dizziness in standing) Patient left: in bed;with call bell/phone within reach;with SCD's reapplied Nurse Communication: Mobility status;Precautions;Weight bearing status (pt used bed pan w/ success)         Time: 8592-9244 PT Time Calculation (min) (ACUTE ONLY): 29 min   Charges:   PT Evaluation $Initial PT Evaluation Tier I: 1 Procedure PT Treatments $Therapeutic Exercise: 8-22 mins   PT G Codes:       Joslyn Hy PT, DPT [phone] Pager: [phone] 07/17/2014, 4:20 PM

## 2014-07-17 NOTE — Anesthesia Procedure Notes (Signed)
Spinal Patient location during procedure: OR Staffing Anesthesiologist: Duane Boston Performed by: anesthesiologist  Preanesthetic Checklist Completed: patient identified, surgical consent, pre-op evaluation, timeout performed, IV checked, risks and benefits discussed and monitors and equipment checked Spinal Block Patient position: sitting Prep: Betadine Patient monitoring: cardiac monitor, continuous pulse ox and blood pressure Approach: midline Location: L2-3 Injection technique: single-shot Needle Needle type: Quincke  Needle gauge: 25 G Needle length: 9 cm Additional Notes Functioning IV was confirmed and monitors were applied. Sterile prep and drape, including hand hygiene and sterile gloves were used. The patient was positioned and the spine was prepped. The skin was anesthetized with lidocaine.  Free flow of clear CSF was obtained prior to injecting local anesthetic into the CSF.  The spinal needle aspirated freely following injection.  The needle was carefully withdrawn.  The patient tolerated the procedure well.

## 2014-07-18 LAB — CBC
HCT: 30.8 % — ABNORMAL LOW (ref 36.0–46.0)
Hemoglobin: 9.8 g/dL — ABNORMAL LOW (ref 12.0–15.0)
MCH: 30.7 pg (ref 26.0–34.0)
MCHC: 31.8 g/dL (ref 30.0–36.0)
MCV: 96.6 fL (ref 78.0–100.0)
Platelets: 158 10*3/uL (ref 150–400)
RBC: 3.19 MIL/uL — AB (ref 3.87–5.11)
RDW: 14.3 % (ref 11.5–15.5)
WBC: 7.1 10*3/uL (ref 4.0–10.5)

## 2014-07-18 LAB — BASIC METABOLIC PANEL
Anion gap: 8 (ref 5–15)
BUN: 11 mg/dL (ref 6–23)
CO2: 26 mmol/L (ref 19–32)
CREATININE: 0.93 mg/dL (ref 0.50–1.10)
Calcium: 8.1 mg/dL — ABNORMAL LOW (ref 8.4–10.5)
Chloride: 100 mmol/L (ref 96–112)
GFR calc non Af Amer: 54 mL/min — ABNORMAL LOW (ref >90)
GFR, EST AFRICAN AMERICAN: 63 mL/min — AB (ref >90)
GLUCOSE: 123 mg/dL — AB (ref 70–99)
POTASSIUM: 4.5 mmol/L (ref 3.5–5.1)
Sodium: 134 mmol/L — ABNORMAL LOW (ref 135–145)

## 2014-07-18 NOTE — Evaluation (Addendum)
Occupational Therapy Evaluation Patient Details Name: Erin Lutz MRN: [id number] DOB: Feb 13, 1926 Today's Date: 07/18/2014    History of Present Illness Pt is a 81 y/o F s/p L THA, post precautions.  Pt's PMH includes breast cancer, numbness B feet, osteoporosis, urinary frequency, GERD.    Clinical Impression   Pt s/p above. Education provided in session. Pt familiar with AE from last hip surgery. OT signing off.    Follow Up Recommendations  No OT follow up;Supervision - Intermittent (mobility)   Equipment Recommendations  None recommended by OT    Recommendations for Other Services       Precautions / Restrictions Precautions Precautions: Posterior Hip;Fall Precaution Comments: reviewed post hip precautions.  Pt did not remember IR precaution Restrictions Weight Bearing Restrictions: Yes LLE Weight Bearing: Weight bearing as tolerated      Mobility Bed Mobility               General bed mobility comments: not assessed  Transfers Overall transfer level: Needs assistance   Transfers: Sit to/from Stand Sit to Stand: Min guard         General transfer comment: cues for technique/hand placement.    Balance  Min guard for ambulation with RW.                                          ADL Overall ADL's : Needs assistance/impaired                     Lower Body Dressing: Minimal assistance;Sit to/from stand;With adaptive equipment   Toilet Transfer: Min guard;Ambulation;RW (3 in 1 over commode)   Toileting- Clothing Manipulation and Hygiene: Min guard;Sit to/from stand       Functional mobility during ADLs: Min guard;Rolling walker General ADL Comments: Educated on LB dressing technique. Reviewed AE-pt familiar from last hip surgery. Educated on safety such as rugs/items on floor and use of bag on walker.  Suggested using 3 in 1 to sit on for sponge bathing as pt sponge bathes at home and does not use tub.Cues for hip  precaution when practicing with AE.     Vision     Perception     Praxis      Pertinent Vitals/Pain Pain Assessment: 0-10 Pain Score: 5  Pain Location: left hip  Pain Descriptors / Indicators: Throbbing Pain Intervention(s): Monitored during session;Repositioned     Hand Dominance Right   Extremity/Trunk Assessment Upper Extremity Assessment Upper Extremity Assessment: Overall WFL for tasks assessed   Lower Extremity Assessment Lower Extremity Assessment: Defer to PT evaluation       Communication Communication Communication: No difficulties   Cognition Arousal/Alertness: Awake/alert Behavior During Therapy: WFL for tasks assessed/performed Overall Cognitive Status: Within Functional Limits for tasks assessed       Memory: Decreased short-term memory;Decreased recall of precautions  (could name 2/3 precautions)              General Comments       Exercises       Shoulder Instructions      Home Living Family/patient expects to be discharged to:: Private residence Living Arrangements: Spouse/significant other Available Help at Discharge: Family;Available 24 hours/day Type of Home: House Home Access: Stairs to enter [employer] of Steps: 2 Entrance Stairs-Rails: Right;Left Home Layout: Two level;Able to live on main level with bedroom/bathroom Alternate Level Stairs-Number of Steps:  13 Alternate Level Stairs-Rails: Right;Left;Can reach both (thinks she can reach both at same time) Bathroom Shower/Tub: Teacher, early years/pre: Standard Bathroom Accessibility: Yes How Accessible: Accessible via walker Home Equipment: [place] - single point;Bedside commode;Walker - 2 wheels;Adaptive equipment Adaptive Equipment: Reacher;Sock aid;Long-handled shoe horn;Long-handled sponge        Prior Functioning/Environment Level of Independence: Needs assistance  Gait / Transfers Assistance Needed: cane and HHA from husband for ambulation ADL's  / Homemaking Assistance Needed: husband does laundry, occassionally needs assist donning/doffing socks and shoes        OT Diagnosis: Acute pain   OT Problem List:     OT Treatment/Interventions:      OT Goals(Current goals can be found in the care plan section)    OT Frequency:     Barriers to D/C:            Co-evaluation              End of Session Equipment Utilized During Treatment: Gait belt;Rolling walker Nurse Communication: Other (comment) (IV beeping)  Activity Tolerance: Patient tolerated treatment well Patient left: in chair;with call bell/phone within reach;with family/visitor present   Time: 8333-8329 OT Time Calculation (min): 22 min Charges:  OT General Charges $OT Visit: 1 Procedure OT Evaluation $Initial OT Evaluation Tier I: 1 Procedure G-CodesBenito Mccreedy OTR/L [id number] 07/18/2014, 2:08 PM

## 2014-07-18 NOTE — Progress Notes (Signed)
Physical Therapy Treatment Patient Details Name: Erin Lutz MRN: [id number] DOB: 02-Jul-1925 Today's Date: 07/18/2014    History of Present Illness Pt is a 81 y/o F s/p L THA, post precautions.  Pt's PMH includes breast cancer, numbness B feet, osteoporosis, urinary frequency, GERD.     PT Comments    Based on pt's improved progress this session d/c plan has been updated to home w/ HHPT once medically cleared for d/c.  Pt has 24/7 assist at home and was able to successfully ascend/descend stairs this session.  Pt will benefit from continued PT services to assist w/ improving endurance and independence w/ functional activities.     Follow Up Recommendations  Home health PT;Supervision/Assistance - 24 hour     Equipment Recommendations  None recommended by PT    Recommendations for Other Services OT consult     Precautions / Restrictions Precautions Precautions: Posterior Hip;Fall Precaution Comments: reviewed post hip precautions.  Pt did not remember IR precaution Restrictions Weight Bearing Restrictions: Yes LLE Weight Bearing: Weight bearing as tolerated    Mobility  Bed Mobility Overal bed mobility: Modified Independent Bed Mobility: Supine to Sit     Supine to sit: HOB elevated;Modified independent (Device/Increase time)     General bed mobility comments: Max increased time and use of rails for supine>sit  Transfers Overall transfer level: Needs assistance Equipment used: Rolling walker (2 wheeled) Transfers: Sit to/from Stand Sit to Stand: Min guard         General transfer comment: Multiple verbal cues for hand placement to avoid pulling on RW.  Pt w/ dizziness standing EOB which dissipated prior to ambulating.    Ambulation/Gait Ambulation/Gait assistance: Min guard Ambulation Distance (Feet): 40 Feet Assistive device: Rolling walker (2 wheeled) Gait Pattern/deviations: Step-to pattern;Decreased stance time - left;Decreased stride  length;Shuffle;Antalgic Gait velocity: decreased Gait velocity interpretation: Below normal speed for age/gender General Gait Details: Pt w/ decreased gait speed, occassionally shuffling of LLE which pt is able to correct following verbal cues.   Stairs Stairs: Yes Stairs assistance: Min guard Stair Management: No rails;Step to pattern;Backwards;With walker Number of Stairs: 2 General stair comments: Pt requires close guarding during stair ascent/descend as she is initially hesitant.  Verbal cues for sequencing.  Wheelchair Mobility    Modified Rankin (Stroke Patients Only)       Balance Overall balance assessment: Needs assistance Sitting-balance support: No upper extremity supported;Feet supported Sitting balance-Leahy Scale: Fair     Standing balance support: Bilateral upper extremity supported;During functional activity Standing balance-Leahy Scale: Fair                      Cognition Arousal/Alertness: Awake/alert Behavior During Therapy: WFL for tasks assessed/performed Overall Cognitive Status: Within Functional Limits for tasks assessed       Memory: Decreased recall of precautions              Exercises Total Joint Exercises Ankle Circles/Pumps: AROM;Both;10 reps;Supine Hip ABduction/ADduction: AROM;Left;Supine;10 reps Long Arc Quad: AROM;10 reps;Both;Seated    General Comments General comments (skin integrity, edema, etc.): Pt fatigues easily and pt educated on the importance of taking breaks when needed and to have helper (husband or daughter in law) next to her at all times when she is up OOB at home.      Pertinent Vitals/Pain Pain Assessment: 0-10 Pain Score: 8  Pain Location: L hip Pain Descriptors / Indicators: Aching;Grimacing;Guarding;Heaviness Pain Intervention(s): Limited activity within patient's tolerance;Monitored during session;Repositioned    Home Living  Prior Function            PT  Goals (current goals can now be found in the care plan section) Acute Rehab PT Goals Patient Stated Goal: to participate in therapy Progress towards PT goals: Progressing toward goals    Frequency  7X/week    PT Plan Discharge plan needs to be updated    Co-evaluation             End of Session Equipment Utilized During Treatment: Gait belt Activity Tolerance: Patient limited by fatigue;Patient tolerated treatment well Patient left: in chair;with call bell/phone within reach     Time: 1018-1052 PT Time Calculation (min) (ACUTE ONLY): 34 min  Charges:  $Gait Training: 8-22 mins $Therapeutic Exercise: 8-22 mins                    G Codes:      Joslyn Hy PT, [place] [phone] Pager: [phone] 07/18/2014, 11:13 AM

## 2014-07-18 NOTE — Progress Notes (Signed)
Physical Therapy Treatment Patient Details Name: Erin Lutz MRN: [id number] DOB: Jan 29, 1926 Today's Date: 07/18/2014    History of Present Illness Pt is a 81 y/o F s/p L THA, post precautions.  Pt's PMH includes breast cancer, numbness B feet, osteoporosis, urinary frequency, GERD.     PT Comments    Patient is making good progress with PT.  From a mobility standpoint anticipate patient will be ready for DC home tomorrow.  Pt requires verbal cues to keep RW close to body during ambulation and has tendency to maintain trunk flexion.     Follow Up Recommendations  Home health PT;Supervision/Assistance - 24 hour     Equipment Recommendations  None recommended by PT    Recommendations for Other Services       Precautions / Restrictions Precautions Precautions: Posterior Hip;Fall Precaution Comments: Pt remembered 3/3 precautions w/o cueing Restrictions Weight Bearing Restrictions: Yes LLE Weight Bearing: Weight bearing as tolerated    Mobility  Bed Mobility Overal bed mobility: Needs Assistance Bed Mobility: Sit to Supine       Sit to supine: Min assist   General bed mobility comments: assist bringing BLEs into bed  Transfers Overall transfer level: Needs assistance Equipment used: Rolling walker (2 wheeled) Transfers: Sit to/from Stand Sit to Stand: Min guard         General transfer comment: cues for technique/hand placement.  Ambulation/Gait Ambulation/Gait assistance: Supervision Ambulation Distance (Feet): 180 Feet Assistive device: Rolling walker (2 wheeled) Gait Pattern/deviations: Step-to pattern;Decreased stance time - left;Decreased stride length;Antalgic;Trunk flexed Gait velocity: decreased Gait velocity interpretation: Below normal speed for age/gender General Gait Details: Despite max verbal cues, pt w/ flexed trunk and RW out in front away from body   Stairs            Wheelchair Mobility    Modified Rankin (Stroke Patients  Only)       Balance Overall balance assessment: Needs assistance Sitting-balance support: No upper extremity supported;Feet supported Sitting balance-Leahy Scale: Good     Standing balance support: Bilateral upper extremity supported;During functional activity Standing balance-Leahy Scale: Fair                      Cognition Arousal/Alertness: Awake/alert Behavior During Therapy: WFL for tasks assessed/performed Overall Cognitive Status: Within Functional Limits for tasks assessed       Memory: Decreased short-term memory;Decreased recall of precautions              Exercises Total Joint Exercises Long Arc Quad: AROM;10 reps;Both;Seated    General Comments        Pertinent Vitals/Pain Pain Assessment: 0-10 Pain Score: 5  Pain Location: L hip Pain Descriptors / Indicators: Aching Pain Intervention(s): Limited activity within patient's tolerance;Monitored during session;Repositioned    Home Living Family/patient expects to be discharged to:: Private residence Living Arrangements: Spouse/significant other Available Help at Discharge: Family;Available 24 hours/day Type of Home: House Home Access: Stairs to enter Entrance Stairs-Rails: Right;Left Home Layout: Two level;Able to live on main level with bedroom/bathroom Home Equipment: Kasandra [employer] - single point;Bedside commode;Walker - 2 wheels;Adaptive equipment      Prior Function Level of Independence: Needs assistance  Gait / Transfers Assistance Needed: cane and HHA from husband for ambulation ADL's / Homemaking Assistance Needed: husband does laundry, occassionally needs assist donning/doffing socks and shoes     PT Goals (current goals can now be found in the care plan section) Acute Rehab PT Goals Patient Stated Goal: to walk Progress towards  PT goals: Progressing toward goals    Frequency  7X/week    PT Plan Current plan remains appropriate    Co-evaluation             End of Session  Equipment Utilized During Treatment: Gait belt Activity Tolerance: Patient limited by fatigue;Patient tolerated treatment well Patient left: in bed;with call bell/phone within reach     Time: 1245-8099 PT Time Calculation (min) (ACUTE ONLY): 24 min  Charges:  $Gait Training: 23-37 mins                    G Codes:      Joslyn Hy PT, [place] [phone] Pager: [phone] 07/18/2014, 4:50 PM

## 2014-07-18 NOTE — Progress Notes (Signed)
PATIENT ID: Erin Lutz  MRN: [id number]  DOB/AGE:  79/04/1925 / 81 y.o.  1 Day Post-Op Procedure(s) (LRB): TOTAL HIP ARTHROPLASTY (Left)    PROGRESS NOTE Subjective: Patient is alert, oriented, x1 Nausea, x1 Vomiting, yes passing gas, no Bowel Movement. Taking PO well. Denies SOB, Chest or Calf Pain. Using Incentive Spirometer, PAS in place. Ambulate 2 ft Patient reports pain as 4 on 0-10 scale  .    Objective: Vital signs in last 24 hours: Filed Vitals:   07/17/14 1600 07/17/14 1944 07/18/14 0109 07/18/14 0514  BP:  109/44 112/52 120/52  Pulse:  86 82 87  Temp:  97.6 F (36.4 C) 98.5 F (36.9 C) 99 F (37.2 C)  TempSrc:  Oral Oral Oral  Resp: 16     Weight:      SpO2: 100% 98% 98% 97%      Intake/Output from previous day: I/O last 3 completed shifts: In: 73 [P.O.:120; I.V.:700] Out: 300 [Urine:200; Blood:100]   Intake/Output this shift:     LABORATORY DATA: No results for input(s): WBC, HGB, HCT, PLT, NA, K, CL, CO2, BUN, CREATININE, GLUCOSE, GLUCAP, INR, CALCIUM in the last 72 hours.  Invalid input(s): PT, 2  Examination: Neurologically intact ABD soft Neurovascular intact Sensation intact distally Intact pulses distally Dorsiflexion/Plantar flexion intact Incision: scant drainage No cellulitis present Compartment soft} XR AP&Lat of hip shows well placed\fixed THA  Assessment:   1 Day Post-Op Procedure(s) (LRB): TOTAL HIP ARTHROPLASTY (Left) ADDITIONAL DIAGNOSIS:  Expected Acute Blood Loss Anemia,   Plan: PT/OT WBAT, THA  posterior precautions  DVT Prophylaxis: SCDx72 hrs, ASA 325 mg BID x 2 weeks  DISCHARGE PLAN: Home, 1-2 day  DISCHARGE NEEDS: HHPT, CPM, Walker and 3-in-1 comode seat

## 2014-07-19 ENCOUNTER — Encounter (HOSPITAL_COMMUNITY): Payer: Self-pay | Admitting: Orthopedic Surgery

## 2014-07-19 LAB — CBC
HEMATOCRIT: 32.4 % — AB (ref 36.0–46.0)
Hemoglobin: 10.2 g/dL — ABNORMAL LOW (ref 12.0–15.0)
MCH: 30 pg (ref 26.0–34.0)
MCHC: 31.5 g/dL (ref 30.0–36.0)
MCV: 95.3 fL (ref 78.0–100.0)
Platelets: 183 10*3/uL (ref 150–400)
RBC: 3.4 MIL/uL — AB (ref 3.87–5.11)
RDW: 14.1 % (ref 11.5–15.5)
WBC: 12.5 10*3/uL — ABNORMAL HIGH (ref 4.0–10.5)

## 2014-07-19 MED ORDER — ASPIRIN EC 325 MG PO TBEC: 325.0000 mg | DELAYED_RELEASE_TABLET | Freq: Two times a day (BID) | ORAL | Status: DC

## 2014-07-19 MED ORDER — METHOCARBAMOL 500 MG PO TABS: 500.0000 mg | ORAL_TABLET | Freq: Two times a day (BID) | ORAL | Status: DC

## 2014-07-19 MED ORDER — OXYCODONE-ACETAMINOPHEN 5-325 MG PO TABS: 1.0000 | ORAL_TABLET | Freq: Four times a day (QID) | ORAL | Status: DC | PRN

## 2014-07-19 NOTE — Discharge Instructions (Signed)

## 2014-07-19 NOTE — Discharge Summary (Signed)
Patient ID: Erin Lutz MRN: [id number] DOB/AGE: 10-05-1925 81 y.o.  Admit date: 07/17/2014 Discharge date: 07/19/2014  Admission Diagnoses:  Principal Problem:   Primary osteoarthritis of left hip Active Problems:   Arthritis, hip   Discharge Diagnoses:  Same  Past Medical History  Diagnosis Date  . Hyperlipidemia     takes Vytorin every other day  . Cancer     breast  . Pneumonia 66 yrs ago  . History of bronchitis 70yrs ago  . History of migraine 91yrs ago  . Numbness     bilateral feet  . Arthritis   . Joint pain   . Joint swelling   . Osteoporosis   . History of colon polyps   . Urinary frequency   . Nocturia   . Seasonal allergies   . GERD (gastroesophageal reflux disease)   . Skin cancer of nose     Surgeries: Procedure(s): TOTAL HIP ARTHROPLASTY on 07/17/2014   Consultants:    Discharged Condition: Improved  Hospital Course: LAVANA HUCKEBA is an 81 y.o. female who was admitted 07/17/2014 for operative treatment ofPrimary osteoarthritis of left hip. Patient has severe unremitting pain that affects sleep, daily activities, and work/hobbies. After pre-op clearance the patient was taken to the operating room on 07/17/2014 and underwent  Procedure(s): TOTAL HIP ARTHROPLASTY.    Patient was given perioperative antibiotics: Anti-infectives    Start     Dose/Rate Route Frequency Ordered Stop   07/17/14 0920  cefUROXime (ZINACEF) injection  Status:  Discontinued       As needed 07/17/14 0920 07/17/14 1030   07/17/14 0600  ceFAZolin (ANCEF) IVPB 2 g/50 mL premix     2 g 100 mL/hr over 30 Minutes Intravenous On call to O.R. 07/16/14 1339 07/17/14 0900       Patient was given sequential compression devices, early ambulation, and chemoprophylaxis to prevent DVT.  Patient benefited maximally from hospital stay and there were no complications.    Recent vital signs: Patient Vitals for the past 24 hrs:  BP Temp Pulse Resp SpO2  07/19/14 0452 (!) 146/67  mmHg 98.8 F (37.1 C) 83 18 95 %  07/18/14 2054 (!) 137/56 mmHg 98 F (36.7 C) 86 18 91 %  07/18/14 1300 (!) 132/48 mmHg 98.9 F (37.2 C) 83 20 93 %     Recent laboratory studies:  Recent Labs  07/18/14 0639 07/19/14 0500  WBC 7.1 12.5*  HGB 9.8* 10.2*  HCT 30.8* 32.4*  PLT 158 183  NA 134*  --   K 4.5  --   CL 100  --   CO2 26  --   BUN 11  --   CREATININE 0.93  --   GLUCOSE 123*  --   CALCIUM 8.1*  --      Discharge Medications:     Medication List    STOP taking these medications        ibuprofen 200 MG tablet  Commonly known as:  ADVIL,MOTRIN      TAKE these medications        aspirin EC 325 MG tablet  Take 1 tablet (325 mg total) by mouth 2 (two) times daily.     CALCIUM PO  Take 2 tablets by mouth daily. OTC centrum cal     ezetimibe-simvastatin 10-20 MG per tablet  Commonly known as:  VYTORIN  Take 1 tablet by mouth every other day.     methocarbamol 500 MG tablet  Commonly known as:  ROBAXIN  Take 1 tablet (500 mg total) by mouth 2 (two) times daily with a meal.     oxyCODONE-acetaminophen 5-325 MG per tablet  Commonly known as:  ROXICET  Take 1 tablet by mouth every 6 (six) hours as needed.        Diagnostic Studies: Dg Pelvis Portable  07/17/2014   CLINICAL DATA:  LEFT total hip arthroplasty  EXAM: PORTABLE PELVIS 1-2 VIEWS  COMPARISON:  Portable exam 1122 hours compared 12/12/2013  FINDINGS: BILATERAL hip prostheses now identified, new on LEFT.  No fracture or dislocation.  Bones demineralized.  Pelvic phleboliths again noted.  IMPRESSION: New LEFT hip prosthesis without acute complication.  Old RIGHT hip prosthesis.   Electronically Signed   By: Lavonia Dana M.D.   On: 07/17/2014 11:33    Disposition: 06-Home-Health Care Svc      Discharge Instructions    Call MD / Call 911    Complete by:  As directed   If you experience chest pain or shortness of breath, CALL 911 and be transported to the hospital emergency room.  If you develope a  fever above 101 F, pus (white drainage) or increased drainage or redness at the wound, or calf pain, call your surgeon's office.     Constipation Prevention    Complete by:  As directed   Drink plenty of fluids.  Prune juice may be helpful.  You may use a stool softener, such as Colace (over the counter) 100 mg twice a day.  Use MiraLax (over the counter) for constipation as needed.     Diet - low sodium heart healthy    Complete by:  As directed      Discharge instructions    Complete by:  As directed   Follow up in office with Dr. Mayer Camel in 2 weeks.     Driving restrictions    Complete by:  As directed   No driving for 2 weeks     Follow the hip precautions as taught in Physical Therapy    Complete by:  As directed      Increase activity slowly as tolerated    Complete by:  As directed      Patient may shower    Complete by:  As directed   You may shower without a dressing once there is no drainage.  Do not wash over the wound.  If drainage remains, cover wound with plastic wrap and then shower.           Follow-up Information    Follow up with Kerin Salen, MD In 2 weeks.   Specialty:  Orthopedic Surgery   Contact information:   [place] [address] [phone]        Signed: Hardin Negus, Oralia Criger R 07/19/2014, 8:00 AM

## 2014-07-19 NOTE — Progress Notes (Signed)
Juleen China discharged home per MD order. Discharge instructions reviewed and discussed with patient. All questions and concerns answered. Copy of instructions and scripts given to patient. IV removed.  Patient escorted to car by staff in a wheelchair. No distress noted upon discharge.   Tarri Abernethy R 07/19/2014 12:06 PM

## 2014-07-19 NOTE — Progress Notes (Signed)
PATIENT ID: Erin Lutz  MRN: [id number]  DOB/AGE:  1925/09/10 / 81 y.o.  2 Days Post-Op Procedure(s) (LRB): TOTAL HIP ARTHROPLASTY (Left)    PROGRESS NOTE Subjective: Patient is alert, oriented, no Nausea, no Vomiting, yes passing gas, no Bowel Movement. Taking PO well. Denies SOB, Chest or Calf Pain. Using Incentive Spirometer, PAS in place. Ambulate WBAT with pt walking over 200 ft with therapy. Patient reports pain as mild  .    Objective: Vital signs in last 24 hours: Filed Vitals:   07/18/14 0514 07/18/14 1300 07/18/14 2054 07/19/14 0452  BP: 120/52 132/48 137/56 146/67  Pulse: 87 83 86 83  Temp: 99 F (37.2 C) 98.9 F (37.2 C) 98 F (36.7 C) 98.8 F (37.1 C)  TempSrc: Oral     Resp:  20 18 18   Weight:      SpO2: 97% 93% 91% 95%      Intake/Output from previous day: I/O last 3 completed shifts: In: 1080 [P.O.:1080] Out: -    Intake/Output this shift:     LABORATORY DATA:  Recent Labs  07/18/14 0639 07/19/14 0500  WBC 7.1 12.5*  HGB 9.8* 10.2*  HCT 30.8* 32.4*  PLT 158 183  NA 134*  --   K 4.5  --   CL 100  --   CO2 26  --   BUN 11  --   CREATININE 0.93  --   GLUCOSE 123*  --   CALCIUM 8.1*  --     Examination: Neurologically intact Neurovascular intact Sensation intact distally Intact pulses distally Dorsiflexion/Plantar flexion intact Incision: dressing C/D/I No cellulitis present Compartment soft} XR AP&Lat of hip shows well placed\fixed THA  Assessment:   2 Days Post-Op Procedure(s) (LRB): TOTAL HIP ARTHROPLASTY (Left) ADDITIONAL DIAGNOSIS:  Expected Acute Blood Loss Anemia,   Plan: PT/OT WBAT, THA  posterior precautions  DVT Prophylaxis: SCDx72 hrs, ASA 325 mg BID x 2 weeks  DISCHARGE PLAN: Home  DISCHARGE NEEDS: HHPT, HHRN, Walker and 3-in-1 comode seat

## 2014-07-19 NOTE — Progress Notes (Signed)
Physical Therapy Treatment Patient Details Name: Erin Lutz MRN: [id number] DOB: Apr 29, 1925 Today's Date: 07/19/2014    History of Present Illness Pt is a 81 y/o F s/p L THA, post precautions.  Pt's PMH includes breast cancer, numbness B feet, osteoporosis, urinary frequency, GERD.     PT Comments    Patient is making good progress with PT.  From a mobility standpoint anticipate patient will be ready for DC home today w/ 24/7 assist from husband.  Pt reports fatigue after ambulating 200 ft this session and pt will benefit from additional PT services to focus on endurance and improving functional independence.     Follow Up Recommendations  Home health PT;Supervision/Assistance - 24 hour     Equipment Recommendations  None recommended by PT    Recommendations for Other Services       Precautions / Restrictions Precautions Precautions: Posterior Hip;Fall Precaution Comments: Pt remembered 3/3 precautions w/o cueing Restrictions Weight Bearing Restrictions: Yes LLE Weight Bearing: Weight bearing as tolerated    Mobility  Bed Mobility Overal bed mobility: Needs Assistance Bed Mobility: Sit to Supine       Sit to supine: Min assist   General bed mobility comments: assist bringing BLEs into bed  Transfers Overall transfer level: Needs assistance Equipment used: Rolling walker (2 wheeled) Transfers: Sit to/from Stand Sit to Stand: Min guard         General transfer comment: cues for technique/hand placement.  Ambulation/Gait Ambulation/Gait assistance: Supervision Ambulation Distance (Feet): 200 Feet Assistive device: Rolling walker (2 wheeled) Gait Pattern/deviations: Step-through pattern;Antalgic;Trunk flexed;Decreased weight shift to left;Decreased stance time - left;Decreased stride length Gait velocity: decreased Gait velocity interpretation: Below normal speed for age/gender General Gait Details: Pt improved use of RW by keeping it closer to her this  session w/ min verbal cues.  Pt demonstrated ability to perform heel strike and toe off during gait cycle.   Stairs            Wheelchair Mobility    Modified Rankin (Stroke Patients Only)       Balance Overall balance assessment: Needs assistance Sitting-balance support: No upper extremity supported;Feet supported Sitting balance-Leahy Scale: Good     Standing balance support: Bilateral upper extremity supported;During functional activity Standing balance-Leahy Scale: Fair                      Cognition Arousal/Alertness: Awake/alert Behavior During Therapy: WFL for tasks assessed/performed Overall Cognitive Status: Within Functional Limits for tasks assessed                      Exercises Total Joint Exercises Knee Flexion: Standing;AROM;Left;10 reps Marching in Standing: AROM;Both;10 reps;Standing    General Comments General comments (skin integrity, edema, etc.): Increased edema in BLEs which pt says is her baseline.  RN notified that it might beneficial for application of hose.      Pertinent Vitals/Pain Pain Assessment: 0-10 Pain Score: 5  Pain Location: L hip Pain Descriptors / Indicators: Aching;Guarding;Dull;Discomfort Pain Intervention(s): Limited activity within patient's tolerance;Monitored during session;Repositioned    Home Living                      Prior Function            PT Goals (current goals can now be found in the care plan section) Acute Rehab PT Goals Patient Stated Goal: to walk Progress towards PT goals: Progressing toward goals    Frequency  7X/week    PT Plan Current plan remains appropriate    Co-evaluation             End of Session Equipment Utilized During Treatment: Gait belt Activity Tolerance: Patient tolerated treatment well;Patient limited by fatigue Patient left: in bed;with call bell/phone within reach     Time: 0916-0933 PT Time Calculation (min) (ACUTE ONLY): 17  min  Charges:  $Gait Training: 8-22 mins                    G Codes:      Joslyn Hy PT, [place] [phone] Pager: [phone] 07/19/2014, 11:06 AM

## 2014-07-20 DIAGNOSIS — Z9181 History of falling: Secondary | ICD-10-CM | POA: Diagnosis not present

## 2014-07-20 DIAGNOSIS — Z96642 Presence of left artificial hip joint: Secondary | ICD-10-CM | POA: Diagnosis not present

## 2014-07-20 DIAGNOSIS — Z471 Aftercare following joint replacement surgery: Secondary | ICD-10-CM | POA: Diagnosis not present

## 2014-07-24 DIAGNOSIS — Z96642 Presence of left artificial hip joint: Secondary | ICD-10-CM | POA: Diagnosis not present

## 2014-07-24 DIAGNOSIS — Z471 Aftercare following joint replacement surgery: Secondary | ICD-10-CM | POA: Diagnosis not present

## 2014-07-24 DIAGNOSIS — Z9181 History of falling: Secondary | ICD-10-CM | POA: Diagnosis not present

## 2014-07-26 DIAGNOSIS — Z96642 Presence of left artificial hip joint: Secondary | ICD-10-CM | POA: Diagnosis not present

## 2014-07-26 DIAGNOSIS — Z9181 History of falling: Secondary | ICD-10-CM | POA: Diagnosis not present

## 2014-07-26 DIAGNOSIS — Z471 Aftercare following joint replacement surgery: Secondary | ICD-10-CM | POA: Diagnosis not present

## 2014-07-27 DIAGNOSIS — Z96642 Presence of left artificial hip joint: Secondary | ICD-10-CM | POA: Diagnosis not present

## 2014-07-27 DIAGNOSIS — Z9181 History of falling: Secondary | ICD-10-CM | POA: Diagnosis not present

## 2014-07-27 DIAGNOSIS — Z471 Aftercare following joint replacement surgery: Secondary | ICD-10-CM | POA: Diagnosis not present

## 2014-07-31 DIAGNOSIS — Z96642 Presence of left artificial hip joint: Secondary | ICD-10-CM | POA: Diagnosis not present

## 2014-07-31 DIAGNOSIS — Z471 Aftercare following joint replacement surgery: Secondary | ICD-10-CM | POA: Diagnosis not present

## 2014-07-31 DIAGNOSIS — Z9181 History of falling: Secondary | ICD-10-CM | POA: Diagnosis not present

## 2014-08-01 DIAGNOSIS — Z96642 Presence of left artificial hip joint: Secondary | ICD-10-CM | POA: Diagnosis not present

## 2014-08-01 DIAGNOSIS — Z471 Aftercare following joint replacement surgery: Secondary | ICD-10-CM | POA: Diagnosis not present

## 2014-08-02 DIAGNOSIS — Z9181 History of falling: Secondary | ICD-10-CM | POA: Diagnosis not present

## 2014-08-02 DIAGNOSIS — Z96642 Presence of left artificial hip joint: Secondary | ICD-10-CM | POA: Diagnosis not present

## 2014-08-02 DIAGNOSIS — Z471 Aftercare following joint replacement surgery: Secondary | ICD-10-CM | POA: Diagnosis not present

## 2014-08-04 DIAGNOSIS — Z471 Aftercare following joint replacement surgery: Secondary | ICD-10-CM | POA: Diagnosis not present

## 2014-08-04 DIAGNOSIS — Z9181 History of falling: Secondary | ICD-10-CM | POA: Diagnosis not present

## 2014-08-04 DIAGNOSIS — Z96642 Presence of left artificial hip joint: Secondary | ICD-10-CM | POA: Diagnosis not present

## 2014-08-08 DIAGNOSIS — Z471 Aftercare following joint replacement surgery: Secondary | ICD-10-CM | POA: Diagnosis not present

## 2014-08-08 DIAGNOSIS — Z96642 Presence of left artificial hip joint: Secondary | ICD-10-CM | POA: Diagnosis not present

## 2014-08-08 DIAGNOSIS — Z9181 History of falling: Secondary | ICD-10-CM | POA: Diagnosis not present

## 2014-08-09 DIAGNOSIS — Z9181 History of falling: Secondary | ICD-10-CM | POA: Diagnosis not present

## 2014-08-09 DIAGNOSIS — Z471 Aftercare following joint replacement surgery: Secondary | ICD-10-CM | POA: Diagnosis not present

## 2014-08-09 DIAGNOSIS — Z96642 Presence of left artificial hip joint: Secondary | ICD-10-CM | POA: Diagnosis not present

## 2014-08-10 DIAGNOSIS — Z471 Aftercare following joint replacement surgery: Secondary | ICD-10-CM | POA: Diagnosis not present

## 2014-08-10 DIAGNOSIS — Z9181 History of falling: Secondary | ICD-10-CM | POA: Diagnosis not present

## 2014-08-10 DIAGNOSIS — Z96642 Presence of left artificial hip joint: Secondary | ICD-10-CM | POA: Diagnosis not present

## 2014-08-15 DIAGNOSIS — Z96642 Presence of left artificial hip joint: Secondary | ICD-10-CM | POA: Diagnosis not present

## 2014-08-15 DIAGNOSIS — Z9181 History of falling: Secondary | ICD-10-CM | POA: Diagnosis not present

## 2014-08-15 DIAGNOSIS — Z471 Aftercare following joint replacement surgery: Secondary | ICD-10-CM | POA: Diagnosis not present

## 2014-08-16 DIAGNOSIS — Z471 Aftercare following joint replacement surgery: Secondary | ICD-10-CM | POA: Diagnosis not present

## 2014-08-16 DIAGNOSIS — Z96642 Presence of left artificial hip joint: Secondary | ICD-10-CM | POA: Diagnosis not present

## 2014-08-16 DIAGNOSIS — Z9181 History of falling: Secondary | ICD-10-CM | POA: Diagnosis not present

## 2014-08-18 DIAGNOSIS — Z471 Aftercare following joint replacement surgery: Secondary | ICD-10-CM | POA: Diagnosis not present

## 2014-08-18 DIAGNOSIS — Z96642 Presence of left artificial hip joint: Secondary | ICD-10-CM | POA: Diagnosis not present

## 2014-08-18 DIAGNOSIS — Z9181 History of falling: Secondary | ICD-10-CM | POA: Diagnosis not present

## 2014-09-01 DIAGNOSIS — C50112 Malignant neoplasm of central portion of left female breast: Secondary | ICD-10-CM | POA: Diagnosis not present

## 2014-09-01 DIAGNOSIS — Z170 Estrogen receptor positive status [ER+]: Secondary | ICD-10-CM | POA: Diagnosis not present

## 2014-09-12 DIAGNOSIS — Z96642 Presence of left artificial hip joint: Secondary | ICD-10-CM | POA: Diagnosis not present

## 2014-09-12 DIAGNOSIS — Z471 Aftercare following joint replacement surgery: Secondary | ICD-10-CM | POA: Diagnosis not present

## 2014-10-04 DIAGNOSIS — Z853 Personal history of malignant neoplasm of breast: Secondary | ICD-10-CM | POA: Diagnosis not present

## 2014-10-04 DIAGNOSIS — Z1231 Encounter for screening mammogram for malignant neoplasm of breast: Secondary | ICD-10-CM | POA: Diagnosis not present

## 2014-10-04 DIAGNOSIS — Z9012 Acquired absence of left breast and nipple: Secondary | ICD-10-CM | POA: Diagnosis not present

## 2014-10-11 DIAGNOSIS — C50912 Malignant neoplasm of unspecified site of left female breast: Secondary | ICD-10-CM | POA: Diagnosis not present

## 2014-10-11 DIAGNOSIS — C50919 Malignant neoplasm of unspecified site of unspecified female breast: Secondary | ICD-10-CM | POA: Diagnosis not present

## 2014-11-01 DIAGNOSIS — Z853 Personal history of malignant neoplasm of breast: Secondary | ICD-10-CM | POA: Diagnosis not present

## 2014-11-01 DIAGNOSIS — M810 Age-related osteoporosis without current pathological fracture: Secondary | ICD-10-CM | POA: Diagnosis not present

## 2015-01-15 DIAGNOSIS — Z23 Encounter for immunization: Secondary | ICD-10-CM | POA: Diagnosis not present

## 2015-03-05 DIAGNOSIS — Z853 Personal history of malignant neoplasm of breast: Secondary | ICD-10-CM | POA: Diagnosis not present

## 2015-03-05 DIAGNOSIS — M810 Age-related osteoporosis without current pathological fracture: Secondary | ICD-10-CM | POA: Diagnosis not present

## 2015-04-18 DIAGNOSIS — M810 Age-related osteoporosis without current pathological fracture: Secondary | ICD-10-CM | POA: Diagnosis not present

## 2015-09-03 DIAGNOSIS — M810 Age-related osteoporosis without current pathological fracture: Secondary | ICD-10-CM | POA: Diagnosis not present

## 2015-09-03 DIAGNOSIS — C50112 Malignant neoplasm of central portion of left female breast: Secondary | ICD-10-CM | POA: Diagnosis not present

## 2015-09-03 DIAGNOSIS — Z853 Personal history of malignant neoplasm of breast: Secondary | ICD-10-CM | POA: Diagnosis not present

## 2015-09-17 DIAGNOSIS — Z0000 Encounter for general adult medical examination without abnormal findings: Secondary | ICD-10-CM | POA: Diagnosis not present

## 2015-09-17 DIAGNOSIS — K5901 Slow transit constipation: Secondary | ICD-10-CM | POA: Diagnosis not present

## 2015-10-05 DIAGNOSIS — Z853 Personal history of malignant neoplasm of breast: Secondary | ICD-10-CM | POA: Diagnosis not present

## 2015-10-05 DIAGNOSIS — Z1231 Encounter for screening mammogram for malignant neoplasm of breast: Secondary | ICD-10-CM | POA: Diagnosis not present

## 2015-10-24 DIAGNOSIS — Z853 Personal history of malignant neoplasm of breast: Secondary | ICD-10-CM | POA: Diagnosis not present

## 2015-11-10 IMAGING — CR DG CHEST 2V
2 series · 2 of 2 positions shown · non-contrast
Comparison: None.

CLINICAL DATA: Preoperative evaluation for right hip surgery

EXAM:
CHEST  2 VIEW

[w chest pa]
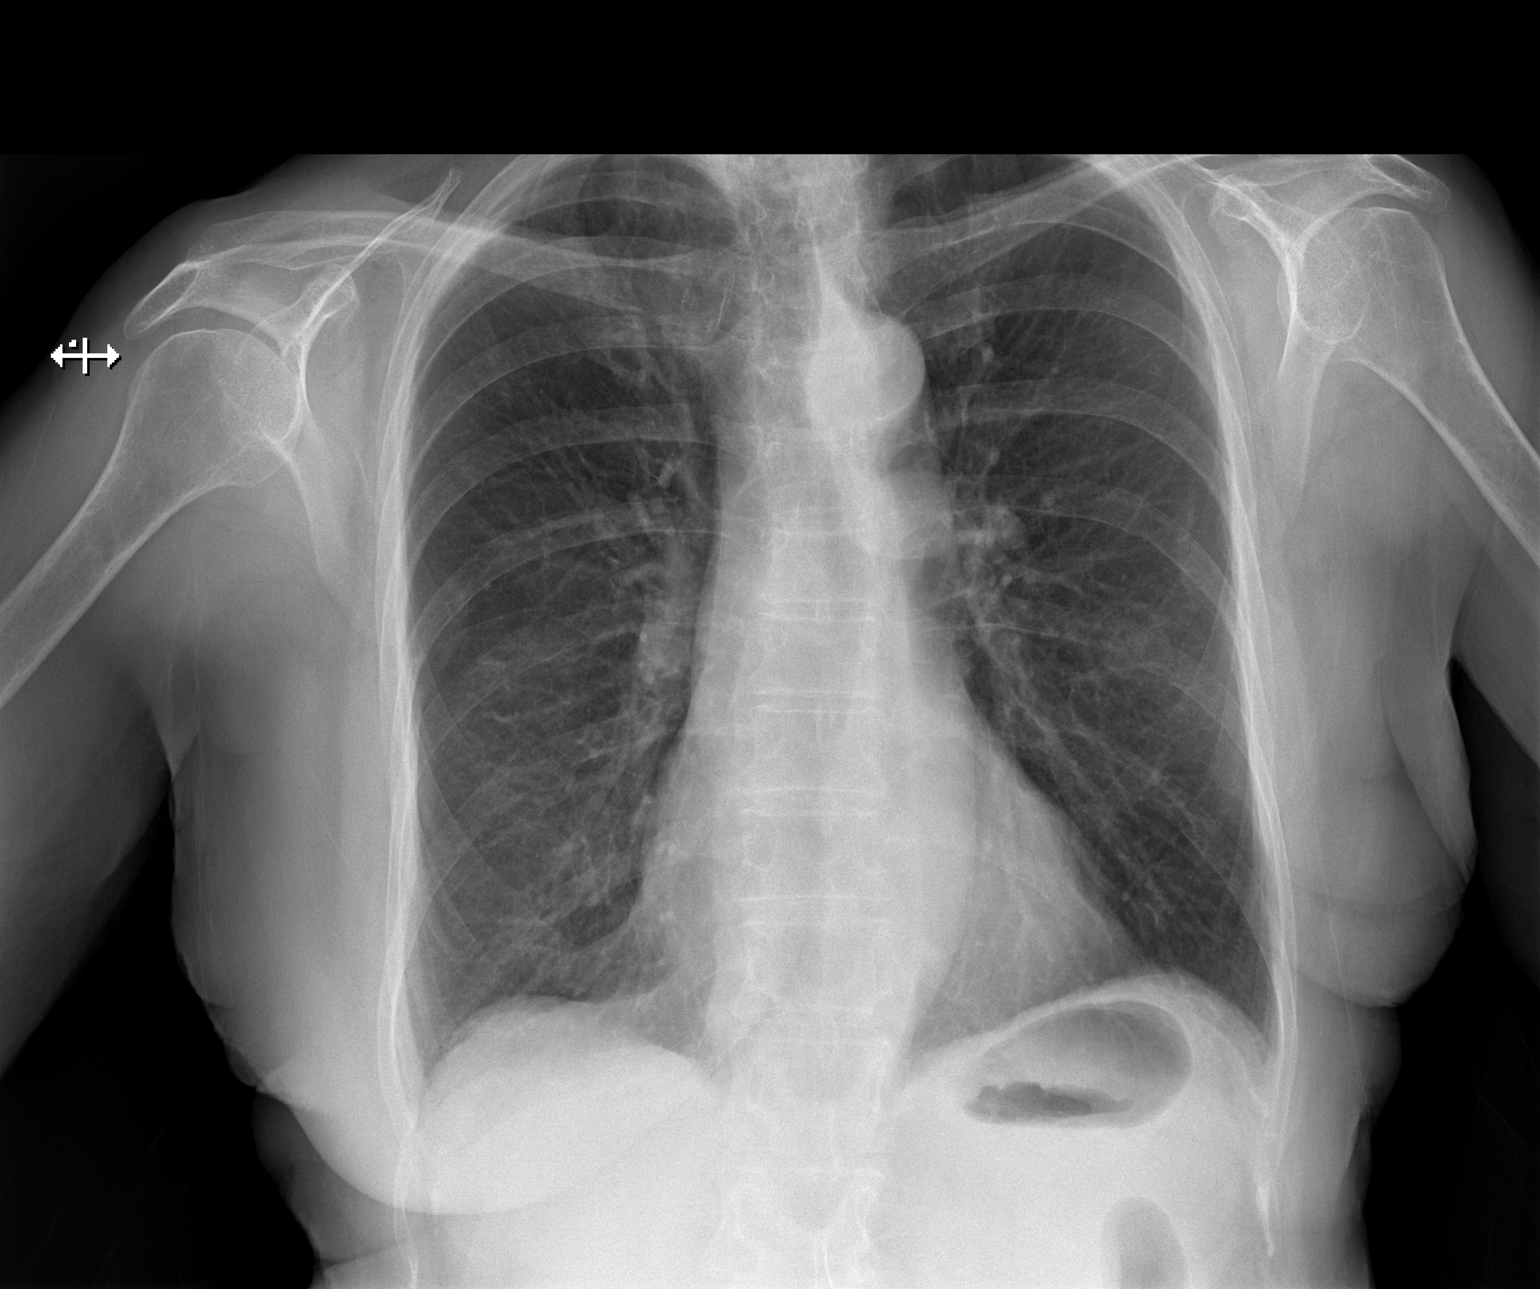

[w chest lat]
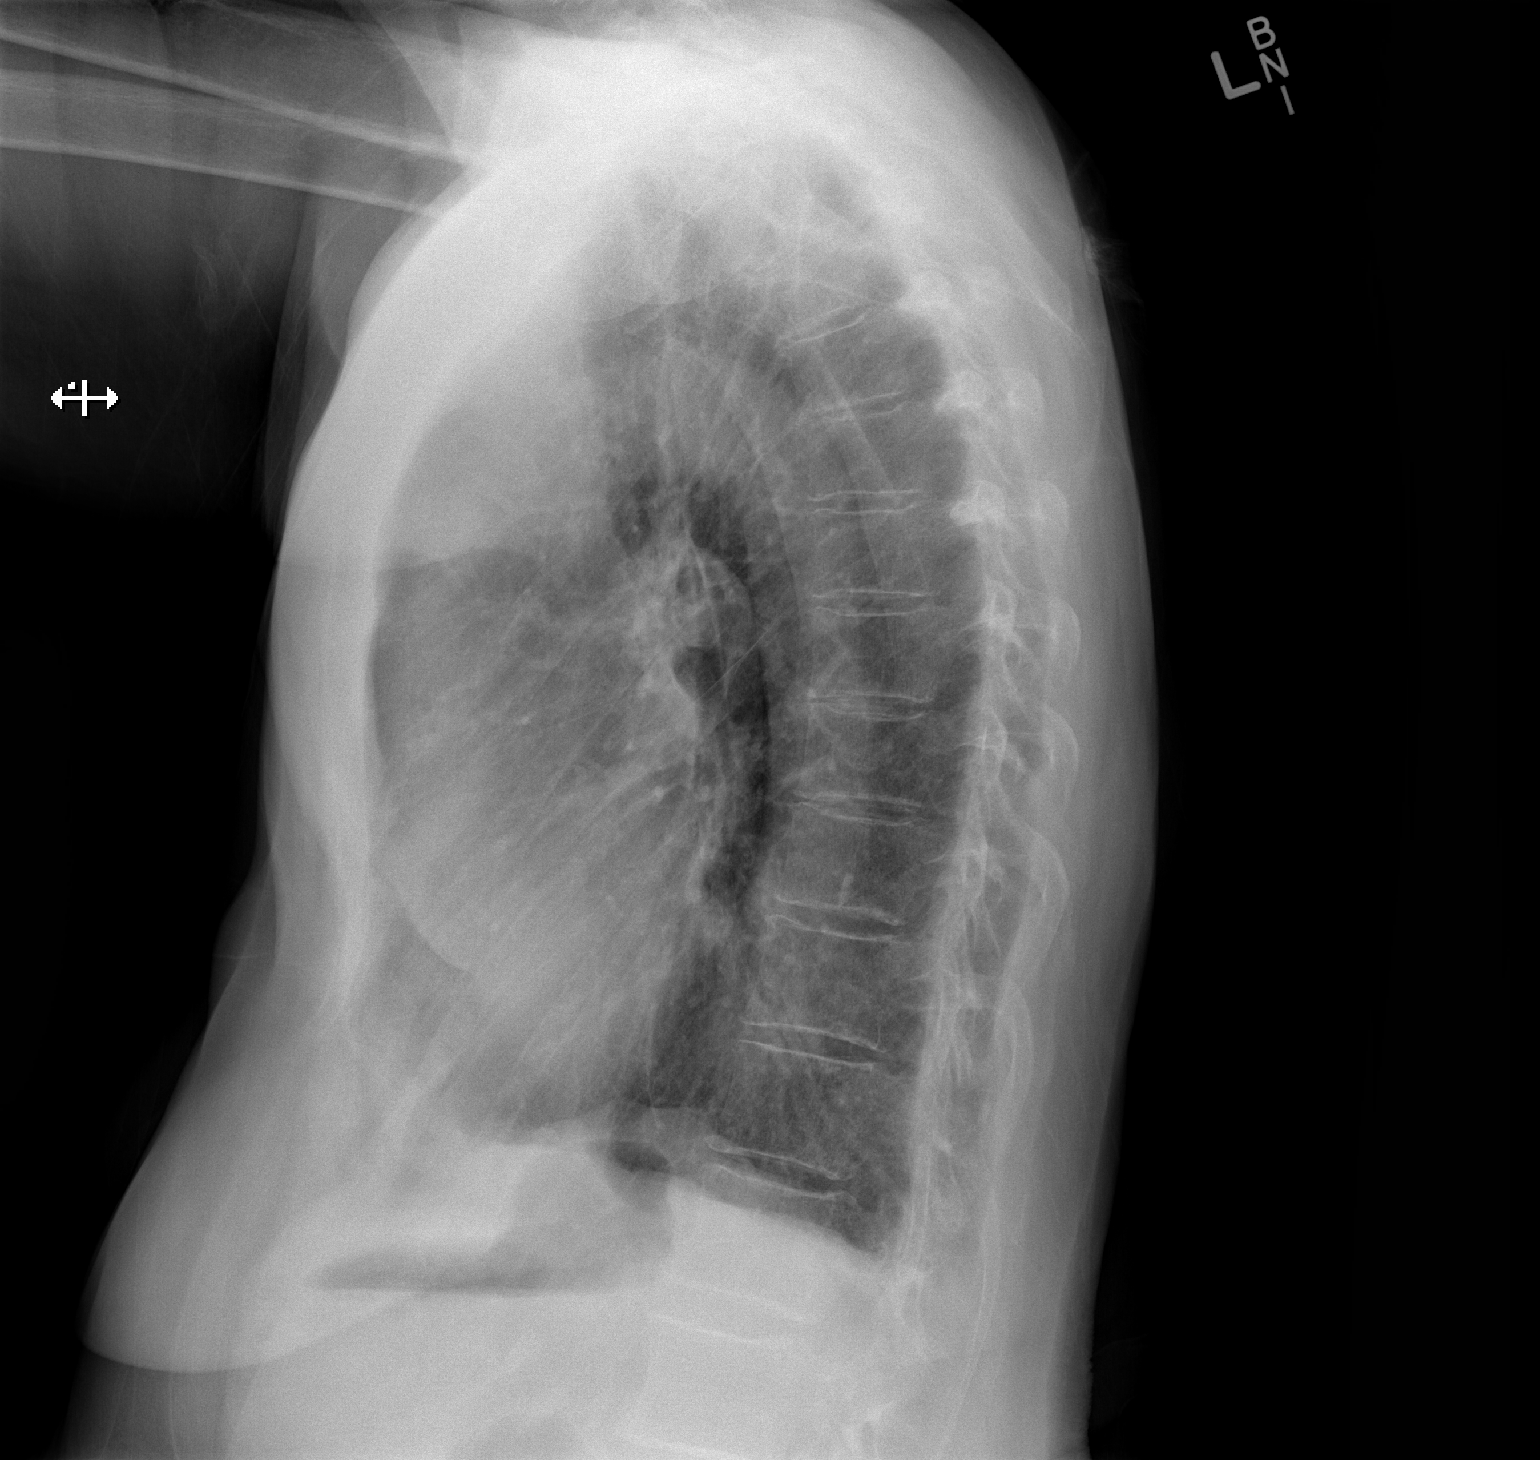

[2 of 2 positions shown; findings below may reference images not displayed]

FINDINGS: Cardiac shadow is within normal limits. The lungs are hyperinflated
consistent with COPD. No focal infiltrate or sizable effusion is
seen. A vague density is noted in the medial aspect of the left lung
apex. Lordotic views of the chest are recommended for further
evaluation.
IMPRESSION: Questionable nodular density in the medial left lung apex. Lordotic
views are recommended for further evaluation.

These results will be called to the ordering clinician or
representative by the Radiologist Assistant, and communication
documented in the PACS or zVision Dashboard.

## 2015-11-20 IMAGING — CR DG PORTABLE PELVIS
1 series · 1 of 1 positions shown · non-contrast
Comparison: None.

CLINICAL DATA: Postoperative right hip.

EXAM:
PORTABLE PELVIS 1-2 VIEWS

[AP]
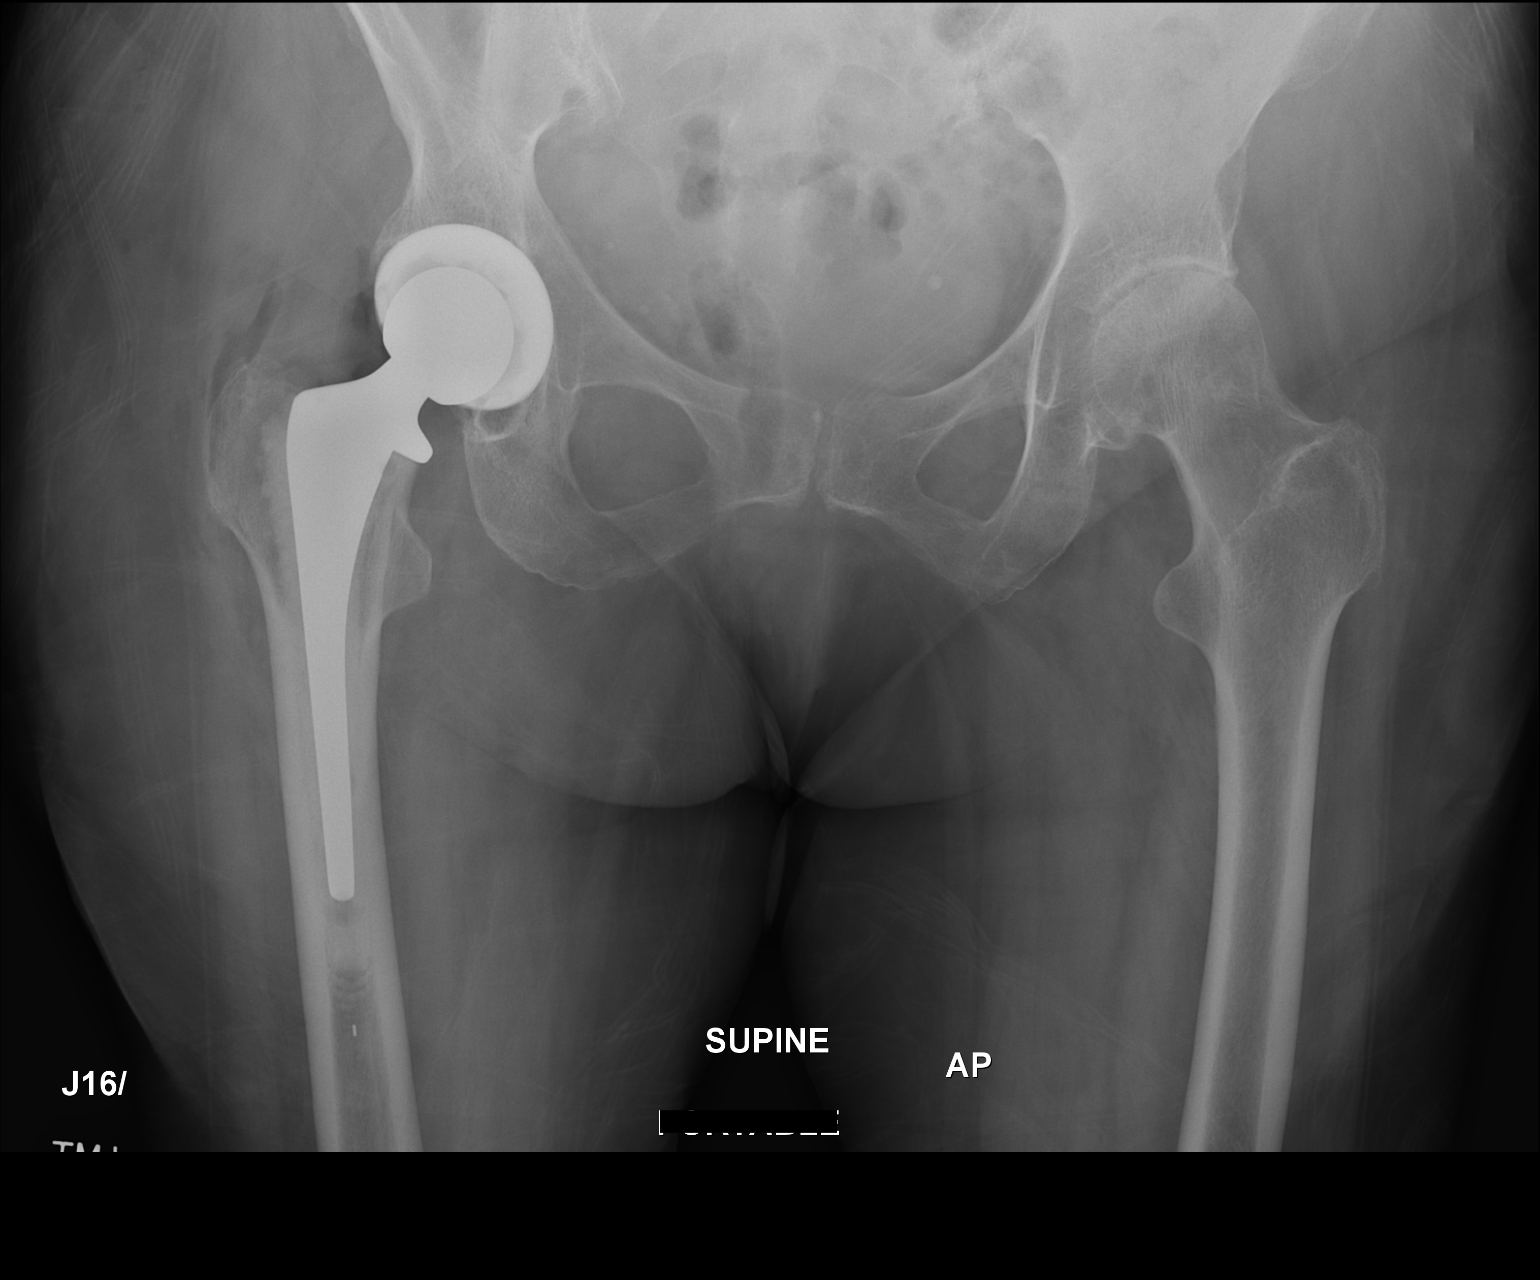

[1 of 1 positions shown; findings below may reference images not displayed]

FINDINGS: Patient status post right hip replacement without malalignment.
Postoperative changes including air within the hip joint space is
noted. No acute fracture dislocation is noted. Degenerative joint
changes of left hip with narrowed joint space is noted.
IMPRESSION: Postoperative right hip replacement without malalignment.

## 2015-12-10 DIAGNOSIS — M533 Sacrococcygeal disorders, not elsewhere classified: Secondary | ICD-10-CM | POA: Diagnosis not present

## 2015-12-10 DIAGNOSIS — Z79899 Other long term (current) drug therapy: Secondary | ICD-10-CM | POA: Diagnosis not present

## 2015-12-10 DIAGNOSIS — E785 Hyperlipidemia, unspecified: Secondary | ICD-10-CM | POA: Diagnosis not present

## 2015-12-10 DIAGNOSIS — Z23 Encounter for immunization: Secondary | ICD-10-CM | POA: Diagnosis not present

## 2015-12-10 DIAGNOSIS — Z1389 Encounter for screening for other disorder: Secondary | ICD-10-CM | POA: Diagnosis not present

## 2015-12-10 DIAGNOSIS — Z0000 Encounter for general adult medical examination without abnormal findings: Secondary | ICD-10-CM | POA: Diagnosis not present

## 2016-06-24 IMAGING — CR DG PORTABLE PELVIS
1 series · 1 of 1 positions shown · non-contrast
Comparison: Portable exam 6633 hours compared 12/12/2013

CLINICAL DATA: LEFT total hip arthroplasty

EXAM:
PORTABLE PELVIS 1-2 VIEWS

[AP]
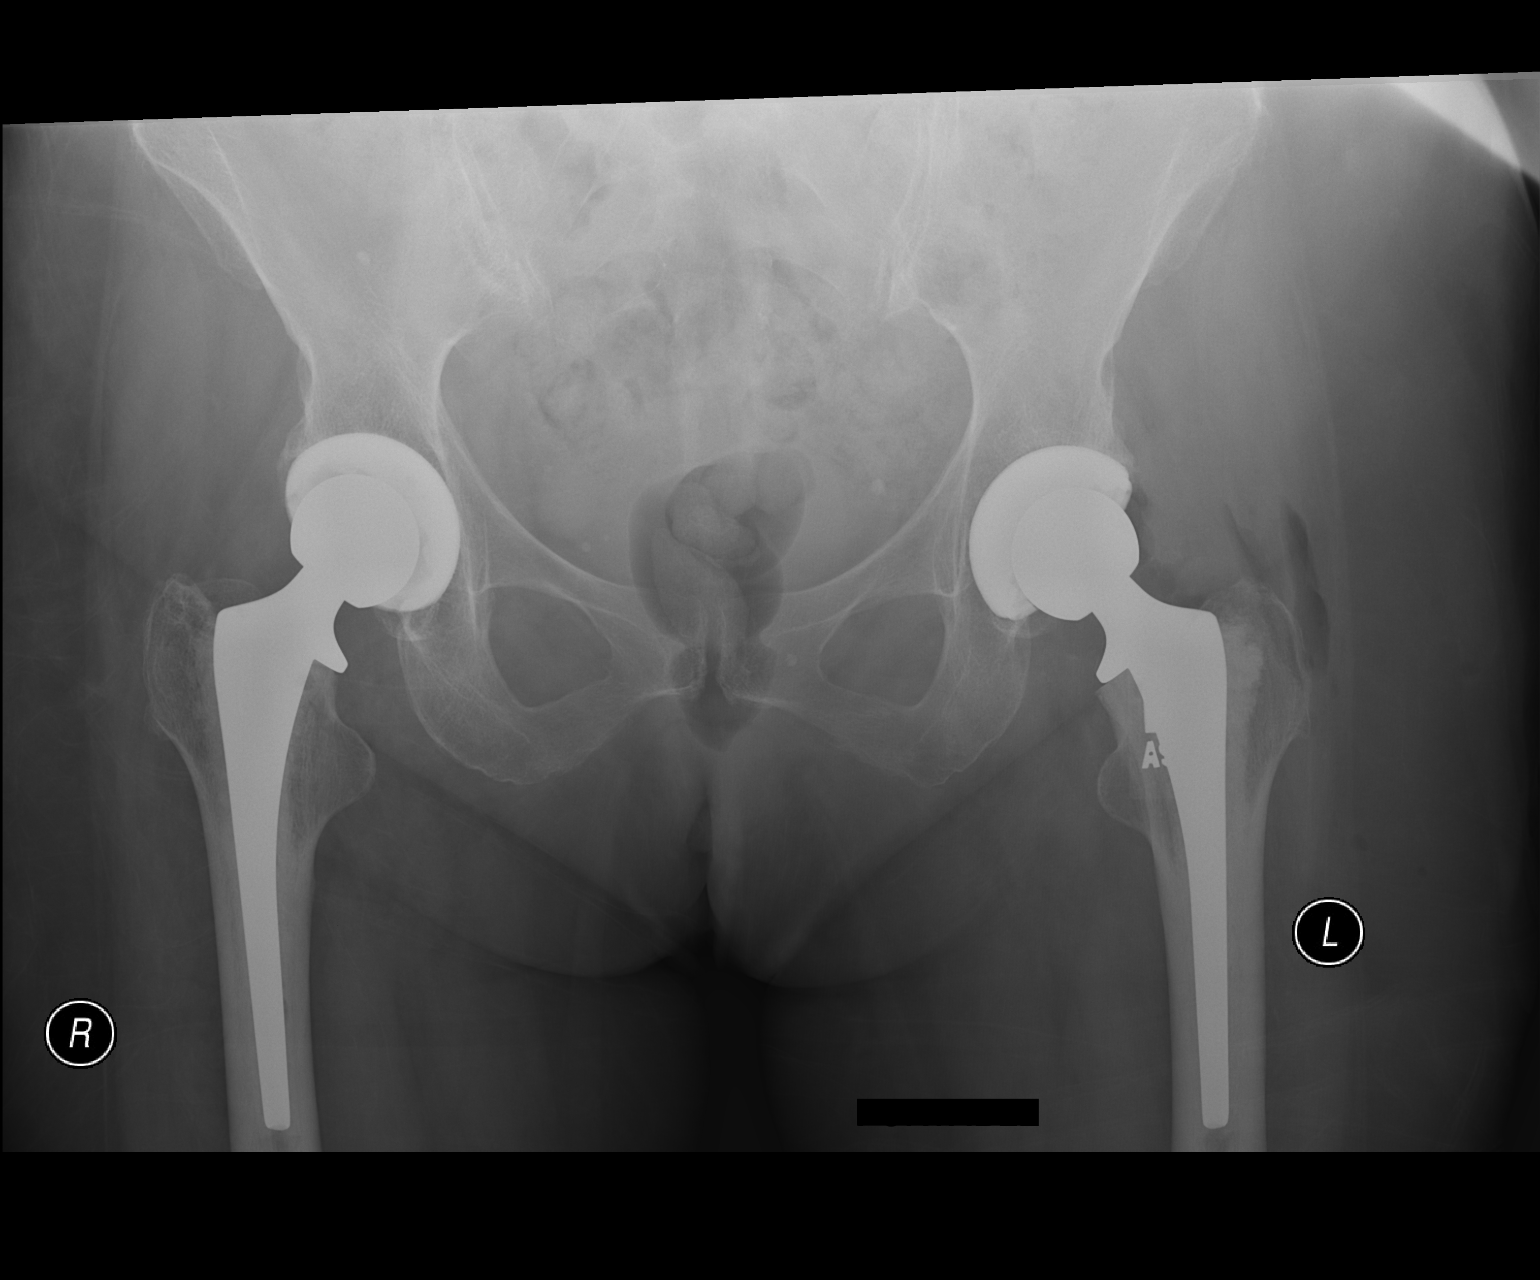

[1 of 1 positions shown; findings below may reference images not displayed]

FINDINGS: BILATERAL hip prostheses now identified, new on LEFT.

No fracture or dislocation.

Bones demineralized.

Pelvic phleboliths again noted.
IMPRESSION: New LEFT hip prosthesis without acute complication.

Old RIGHT hip prosthesis.

## 2016-10-06 DIAGNOSIS — Z1231 Encounter for screening mammogram for malignant neoplasm of breast: Secondary | ICD-10-CM | POA: Diagnosis not present

## 2016-10-15 DIAGNOSIS — Z853 Personal history of malignant neoplasm of breast: Secondary | ICD-10-CM | POA: Diagnosis not present

## 2016-10-22 DIAGNOSIS — L821 Other seborrheic keratosis: Secondary | ICD-10-CM | POA: Diagnosis not present

## 2016-10-22 DIAGNOSIS — D1801 Hemangioma of skin and subcutaneous tissue: Secondary | ICD-10-CM | POA: Diagnosis not present

## 2016-11-03 DIAGNOSIS — F419 Anxiety disorder, unspecified: Secondary | ICD-10-CM | POA: Diagnosis not present

## 2016-11-03 DIAGNOSIS — Z6826 Body mass index (BMI) 26.0-26.9, adult: Secondary | ICD-10-CM | POA: Diagnosis not present

## 2016-11-03 DIAGNOSIS — Z79899 Other long term (current) drug therapy: Secondary | ICD-10-CM | POA: Diagnosis not present

## 2016-11-03 DIAGNOSIS — Z0000 Encounter for general adult medical examination without abnormal findings: Secondary | ICD-10-CM | POA: Diagnosis not present

## 2016-11-03 DIAGNOSIS — R5383 Other fatigue: Secondary | ICD-10-CM | POA: Diagnosis not present

## 2016-11-03 DIAGNOSIS — E785 Hyperlipidemia, unspecified: Secondary | ICD-10-CM | POA: Diagnosis not present

## 2016-12-26 DIAGNOSIS — Z9181 History of falling: Secondary | ICD-10-CM | POA: Diagnosis not present

## 2016-12-26 DIAGNOSIS — M26609 Unspecified temporomandibular joint disorder, unspecified side: Secondary | ICD-10-CM | POA: Diagnosis not present

## 2016-12-26 DIAGNOSIS — R6889 Other general symptoms and signs: Secondary | ICD-10-CM | POA: Diagnosis not present

## 2016-12-26 DIAGNOSIS — Z6826 Body mass index (BMI) 26.0-26.9, adult: Secondary | ICD-10-CM | POA: Diagnosis not present

## 2017-01-14 DIAGNOSIS — Z23 Encounter for immunization: Secondary | ICD-10-CM | POA: Diagnosis not present

## 2017-06-30 DIAGNOSIS — G250 Essential tremor: Secondary | ICD-10-CM | POA: Diagnosis not present

## 2017-06-30 DIAGNOSIS — D510 Vitamin B12 deficiency anemia due to intrinsic factor deficiency: Secondary | ICD-10-CM | POA: Diagnosis not present

## 2017-06-30 DIAGNOSIS — F419 Anxiety disorder, unspecified: Secondary | ICD-10-CM | POA: Diagnosis not present

## 2017-06-30 DIAGNOSIS — Z6826 Body mass index (BMI) 26.0-26.9, adult: Secondary | ICD-10-CM | POA: Diagnosis not present

## 2017-06-30 DIAGNOSIS — F329 Major depressive disorder, single episode, unspecified: Secondary | ICD-10-CM | POA: Diagnosis not present

## 2017-07-02 DIAGNOSIS — D510 Vitamin B12 deficiency anemia due to intrinsic factor deficiency: Secondary | ICD-10-CM | POA: Diagnosis not present

## 2017-07-09 DIAGNOSIS — D510 Vitamin B12 deficiency anemia due to intrinsic factor deficiency: Secondary | ICD-10-CM | POA: Diagnosis not present

## 2017-07-16 DIAGNOSIS — D510 Vitamin B12 deficiency anemia due to intrinsic factor deficiency: Secondary | ICD-10-CM | POA: Diagnosis not present

## 2017-07-23 DIAGNOSIS — D510 Vitamin B12 deficiency anemia due to intrinsic factor deficiency: Secondary | ICD-10-CM | POA: Diagnosis not present

## 2017-07-30 DIAGNOSIS — D510 Vitamin B12 deficiency anemia due to intrinsic factor deficiency: Secondary | ICD-10-CM | POA: Diagnosis not present

## 2017-08-07 DIAGNOSIS — D510 Vitamin B12 deficiency anemia due to intrinsic factor deficiency: Secondary | ICD-10-CM | POA: Diagnosis not present

## 2017-08-13 DIAGNOSIS — D510 Vitamin B12 deficiency anemia due to intrinsic factor deficiency: Secondary | ICD-10-CM | POA: Diagnosis not present

## 2017-08-27 DIAGNOSIS — D510 Vitamin B12 deficiency anemia due to intrinsic factor deficiency: Secondary | ICD-10-CM | POA: Diagnosis not present

## 2017-09-01 DIAGNOSIS — Z6826 Body mass index (BMI) 26.0-26.9, adult: Secondary | ICD-10-CM | POA: Diagnosis not present

## 2017-09-01 DIAGNOSIS — G250 Essential tremor: Secondary | ICD-10-CM | POA: Diagnosis not present

## 2017-10-07 DIAGNOSIS — Z1231 Encounter for screening mammogram for malignant neoplasm of breast: Secondary | ICD-10-CM | POA: Diagnosis not present

## 2017-10-21 DIAGNOSIS — Z853 Personal history of malignant neoplasm of breast: Secondary | ICD-10-CM | POA: Diagnosis not present

## 2017-12-09 ENCOUNTER — Ambulatory Visit: Payer: Medicare Other | Admitting: Neurology

## 2018-01-07 DIAGNOSIS — Z23 Encounter for immunization: Secondary | ICD-10-CM | POA: Diagnosis not present

## 2018-02-11 DIAGNOSIS — D510 Vitamin B12 deficiency anemia due to intrinsic factor deficiency: Secondary | ICD-10-CM | POA: Diagnosis not present

## 2018-02-11 DIAGNOSIS — G250 Essential tremor: Secondary | ICD-10-CM | POA: Diagnosis not present

## 2018-02-11 DIAGNOSIS — Z6826 Body mass index (BMI) 26.0-26.9, adult: Secondary | ICD-10-CM | POA: Diagnosis not present

## 2018-02-11 DIAGNOSIS — Z0000 Encounter for general adult medical examination without abnormal findings: Secondary | ICD-10-CM | POA: Diagnosis not present

## 2018-02-11 DIAGNOSIS — E785 Hyperlipidemia, unspecified: Secondary | ICD-10-CM | POA: Diagnosis not present

## 2018-10-11 DIAGNOSIS — Z1231 Encounter for screening mammogram for malignant neoplasm of breast: Secondary | ICD-10-CM | POA: Diagnosis not present

## 2018-11-03 DIAGNOSIS — Z79899 Other long term (current) drug therapy: Secondary | ICD-10-CM | POA: Diagnosis not present

## 2018-11-03 DIAGNOSIS — G250 Essential tremor: Secondary | ICD-10-CM | POA: Diagnosis not present

## 2018-11-03 DIAGNOSIS — E7800 Pure hypercholesterolemia, unspecified: Secondary | ICD-10-CM | POA: Diagnosis not present

## 2018-11-03 DIAGNOSIS — E785 Hyperlipidemia, unspecified: Secondary | ICD-10-CM | POA: Diagnosis not present

## 2018-11-03 DIAGNOSIS — Z6825 Body mass index (BMI) 25.0-25.9, adult: Secondary | ICD-10-CM | POA: Diagnosis not present

## 2018-11-03 DIAGNOSIS — Z1331 Encounter for screening for depression: Secondary | ICD-10-CM | POA: Diagnosis not present

## 2019-01-04 DIAGNOSIS — Z23 Encounter for immunization: Secondary | ICD-10-CM | POA: Diagnosis not present

## 2019-08-16 DIAGNOSIS — Z79899 Other long term (current) drug therapy: Secondary | ICD-10-CM | POA: Diagnosis not present

## 2019-08-16 DIAGNOSIS — E7800 Pure hypercholesterolemia, unspecified: Secondary | ICD-10-CM | POA: Diagnosis not present

## 2019-08-16 DIAGNOSIS — G250 Essential tremor: Secondary | ICD-10-CM | POA: Diagnosis not present

## 2019-08-16 DIAGNOSIS — Z6824 Body mass index (BMI) 24.0-24.9, adult: Secondary | ICD-10-CM | POA: Diagnosis not present

## 2019-08-16 DIAGNOSIS — Z0000 Encounter for general adult medical examination without abnormal findings: Secondary | ICD-10-CM | POA: Diagnosis not present

## 2019-11-03 DIAGNOSIS — M25461 Effusion, right knee: Secondary | ICD-10-CM | POA: Diagnosis not present

## 2019-11-03 DIAGNOSIS — Z6824 Body mass index (BMI) 24.0-24.9, adult: Secondary | ICD-10-CM | POA: Diagnosis not present

## 2020-01-16 DIAGNOSIS — Z23 Encounter for immunization: Secondary | ICD-10-CM | POA: Diagnosis not present

## 2021-08-09 DIAGNOSIS — Z0000 Encounter for general adult medical examination without abnormal findings: Secondary | ICD-10-CM | POA: Diagnosis not present

## 2021-08-09 DIAGNOSIS — Z1331 Encounter for screening for depression: Secondary | ICD-10-CM | POA: Diagnosis not present

## 2021-08-09 DIAGNOSIS — E7800 Pure hypercholesterolemia, unspecified: Secondary | ICD-10-CM | POA: Diagnosis not present

## 2021-08-09 DIAGNOSIS — Z6823 Body mass index (BMI) 23.0-23.9, adult: Secondary | ICD-10-CM | POA: Diagnosis not present

## 2021-08-09 DIAGNOSIS — Z79899 Other long term (current) drug therapy: Secondary | ICD-10-CM | POA: Diagnosis not present

## 2022-04-18 DIAGNOSIS — W19XXXA Unspecified fall, initial encounter: Secondary | ICD-10-CM | POA: Diagnosis not present

## 2022-04-18 DIAGNOSIS — Z23 Encounter for immunization: Secondary | ICD-10-CM | POA: Diagnosis not present

## 2022-04-18 DIAGNOSIS — M545 Low back pain, unspecified: Secondary | ICD-10-CM | POA: Diagnosis not present

## 2022-12-30 DEATH — deceased
